# Patient Record
Sex: Male | Born: 1995 | Race: Black or African American | Hispanic: No | Marital: Single | Smoking: Current every day smoker
Health system: Southern US, Community
[De-identification: ages and names within clinical notes are randomized; demographics above are authoritative.]

## PROBLEM LIST (undated history)

## (undated) DIAGNOSIS — K589 Irritable bowel syndrome without diarrhea: Secondary | ICD-10-CM

## (undated) DIAGNOSIS — I10 Essential (primary) hypertension: Secondary | ICD-10-CM

## (undated) DIAGNOSIS — D573 Sickle-cell trait: Secondary | ICD-10-CM

## (undated) HISTORY — DX: Irritable bowel syndrome, unspecified: K58.9

---
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
## 2013-01-05 ENCOUNTER — Encounter (HOSPITAL_COMMUNITY): Payer: Self-pay | Admitting: Emergency Medicine

## 2013-01-05 ENCOUNTER — Emergency Department (HOSPITAL_COMMUNITY)
Admission: EM | Admit: 2013-01-05 | Discharge: 2013-01-05 | Disposition: A | Discharge status: Home / Self Care | Payer: Medicaid Other | Attending: Emergency Medicine | Admitting: Emergency Medicine

## 2013-01-05 DIAGNOSIS — Z202 Contact with and (suspected) exposure to infections with a predominantly sexual mode of transmission: Secondary | ICD-10-CM

## 2013-01-05 DIAGNOSIS — D573 Sickle-cell trait: Secondary | ICD-10-CM | POA: Insufficient documentation

## 2013-01-05 DIAGNOSIS — Z7251 High risk heterosexual behavior: Secondary | ICD-10-CM | POA: Insufficient documentation

## 2013-01-05 HISTORY — DX: Sickle-cell trait: D57.3

## 2013-01-05 LAB — URINE MICROSCOPIC-ADD ON

## 2013-01-05 LAB — URINALYSIS, ROUTINE W REFLEX MICROSCOPIC
Glucose, UA: NEGATIVE mg/dL
Ketones, ur: NEGATIVE mg/dL
Protein, ur: NEGATIVE mg/dL
Urobilinogen, UA: 0.2 mg/dL (ref 0.0–1.0)

## 2013-01-05 LAB — HIV ANTIBODY (ROUTINE TESTING W REFLEX): HIV: NONREACTIVE

## 2013-01-05 LAB — RPR: RPR Ser Ql: NONREACTIVE

## 2013-01-05 NOTE — ED Provider Notes (Signed)
History     CSN: [id number]  Arrival date & time 01/05/13  0027   First MD Initiated Contact with Patient 01/05/13 0214      Chief Complaint  Patient presents with  . Exposure to STD    (Consider location/radiation/quality/duration/timing/severity/associated sxs/prior treatment) HPI Comments: Patient presents emergency department complaining of possible exposure to STD.  He reports that he has been sexually active without protection on off with one partner.  He found a white pill at his house and he looked up until plantar., but expressed that it was an HIV medication.  Patient states concerned that his partner that he's been sleeping what may be HIV positive and has not been truthful.  Patient states that he has been tested in the past with negative test results however no recent testing has been performed.  Patient is currently asymptomatic  Patient is a 17 y.o. male presenting with STD exposure. The history is provided by the patient.  Exposure to STD    Past Medical History  Diagnosis Date  . Sickle cell trait     History reviewed. No pertinent past surgical history.  History reviewed. No pertinent family history.  History  Substance Use Topics  . Smoking status: Never Smoker   . Smokeless tobacco: Not on file  . Alcohol Use: Yes      Review of Systems  All other systems reviewed and are negative.    Allergies  Review of patient's allergies indicates no known allergies.  Home Medications  No current outpatient prescriptions on file.  BP 148/84  Pulse 65  Temp(Src) 97.9 F (36.6 C) (Oral)  SpO2 100%  Physical Exam  Nursing note and vitals reviewed. Constitutional: He is oriented to person, place, and time. He appears well-developed and well-nourished. No distress.  HENT:  Head: Normocephalic and atraumatic.  Eyes: Conjunctivae and EOM are normal.  Neck: Normal range of motion.  Pulmonary/Chest: Effort normal.  Musculoskeletal: Normal range of  motion.  Neurological: He is alert and oriented to person, place, and time.  Skin: Skin is warm and dry. No rash noted. He is not diaphoretic.  Psychiatric: He has a normal mood and affect. His behavior is normal.    ED Course  Procedures (including critical care time)  Labs Reviewed  URINALYSIS, ROUTINE W REFLEX MICROSCOPIC - Abnormal; Notable for the following:    Leukocytes, UA SMALL (*)    All other components within normal limits  GC/CHLAMYDIA PROBE AMP  URINE MICROSCOPIC-ADD ON  HIV ANTIBODY (ROUTINE TESTING)  RPR   No results found.   No diagnosis found.    MDM  Possible STD exposure  It is explained to patient that when asymptomatic typically blood STD testing is not done in the emergency department and health department evaluation was recommended.  Patient appears distressed for concern of possible HIV contact therefore an exception was made and HIV RPR testing was performed.  Patient understands that results will not be completed for approximately 3 days.  No other complaints at this time.        Jaci Carrel, [place] 01/05/13 [phone]

## 2013-01-05 NOTE — ED Notes (Signed)
Patient reports that he found a pill laying around his apartment; patient looked up pill on the Internet and found out that it was a medication for HIV.  Patient feels that the pill might belong to his partner, so he wants to be tested for HIV today.  Patient has been tested in the past (several months ago); results negative.

## 2013-01-05 NOTE — ED Notes (Signed)
Pt dc'd home w/all belongings, pt verbalizes understanding of dc instructions, pt alert and ambulatory upon dc, drove self home no narcotics given in ED

## 2013-01-05 NOTE — ED Provider Notes (Signed)
Medical screening examination/treatment/procedure(s) were performed by non-physician practitioner and as supervising physician I was immediately available for consultation/collaboration.  Semiah Konczal, MD 01/05/13 2305 

## 2013-01-05 NOTE — ED Notes (Signed)
Pt reports he found a single pill lying around his house today, looked the pill up and it is prescribed for HIV, pt did not bring the pill with him put took a picture with his phone, the pill is white, oblonged shape, and has the numbers 123 on the front of it. Pt is concerned his partner of 3 years may be taking this medication with out telling him. Pt states they have been in an "on again off again" relationship for 3 years, last unprotected intercourse was Saturday, they reunited 3 months ago and as far as the patients knows his partner is not seeing someone else.  Pt is requesting to be tested for HIV, pt's last HIV test was completed 06/02/2011 and confirmed negative on 06/04/2011

## 2013-01-06 LAB — GC/CHLAMYDIA PROBE AMP: CT Probe RNA: NEGATIVE

## 2013-01-07 ENCOUNTER — Telehealth (HOSPITAL_COMMUNITY): Payer: Self-pay | Admitting: Emergency Medicine

## 2013-01-07 NOTE — ED Notes (Signed)
+   gonorrhea, Chart sent to EDP for review

## 2013-01-08 ENCOUNTER — Telehealth (HOSPITAL_COMMUNITY): Payer: Self-pay | Admitting: Emergency Medicine

## 2013-11-10 DIAGNOSIS — R197 Diarrhea, unspecified: Secondary | ICD-10-CM | POA: Insufficient documentation

## 2013-11-10 DIAGNOSIS — R110 Nausea: Secondary | ICD-10-CM | POA: Insufficient documentation

## 2013-11-10 DIAGNOSIS — R630 Anorexia: Secondary | ICD-10-CM | POA: Insufficient documentation

## 2013-11-10 DIAGNOSIS — E860 Dehydration: Secondary | ICD-10-CM | POA: Insufficient documentation

## 2013-11-10 DIAGNOSIS — Z862 Personal history of diseases of the blood and blood-forming organs and certain disorders involving the immune mechanism: Secondary | ICD-10-CM | POA: Insufficient documentation

## 2013-11-11 ENCOUNTER — Emergency Department (HOSPITAL_COMMUNITY)
Admission: EM | Admit: 2013-11-11 | Discharge: 2013-11-11 | Disposition: A | Discharge status: Home / Self Care | Payer: BC Managed Care – PPO | Attending: Emergency Medicine | Admitting: Emergency Medicine

## 2013-11-11 ENCOUNTER — Encounter (HOSPITAL_COMMUNITY): Payer: Self-pay | Admitting: Emergency Medicine

## 2013-11-11 DIAGNOSIS — R197 Diarrhea, unspecified: Secondary | ICD-10-CM

## 2013-11-11 LAB — CBC WITH DIFFERENTIAL/PLATELET
BASOS ABS: 0 10*3/uL (ref 0.0–0.1)
Basophils Relative: 1 % (ref 0–1)
EOS ABS: 0 10*3/uL (ref 0.0–0.7)
Eosinophils Relative: 0 % (ref 0–5)
HEMATOCRIT: 56.8 % — AB (ref 39.0–52.0)
Hemoglobin: 19.5 g/dL — ABNORMAL HIGH (ref 13.0–17.0)
LYMPHS PCT: 30 % (ref 12–46)
Lymphs Abs: 1.4 10*3/uL (ref 0.7–4.0)
MCH: 27.9 pg (ref 26.0–34.0)
MCHC: 34.3 g/dL (ref 30.0–36.0)
MCV: 81.4 fL (ref 78.0–100.0)
MONO ABS: 0.8 10*3/uL (ref 0.1–1.0)
Monocytes Relative: 16 % — ABNORMAL HIGH (ref 3–12)
NEUTROS ABS: 2.5 10*3/uL (ref 1.7–7.7)
Neutrophils Relative %: 53 % (ref 43–77)
Platelets: 158 10*3/uL (ref 150–400)
RBC: 6.98 MIL/uL — ABNORMAL HIGH (ref 4.22–5.81)
RDW: 14.2 % (ref 11.5–15.5)
WBC: 4.7 10*3/uL (ref 4.0–10.5)

## 2013-11-11 LAB — COMPREHENSIVE METABOLIC PANEL
ALBUMIN: 4.3 g/dL (ref 3.5–5.2)
ALK PHOS: 72 U/L (ref 39–117)
ALT: 22 U/L (ref 0–53)
AST: 31 U/L (ref 0–37)
BUN: 12 mg/dL (ref 6–23)
CALCIUM: 9.5 mg/dL (ref 8.4–10.5)
CO2: 27 mEq/L (ref 19–32)
CREATININE: 1.16 mg/dL (ref 0.50–1.35)
Chloride: 105 mEq/L (ref 96–112)
GFR calc non Af Amer: >90 mL/min (ref >90)
Glucose, Bld: 80 mg/dL (ref 70–99)
POTASSIUM: 4.4 meq/L (ref 3.7–5.3)
Sodium: 144 mEq/L (ref 137–147)
TOTAL PROTEIN: 7.9 g/dL (ref 6.0–8.3)
Total Bilirubin: 0.5 mg/dL (ref 0.3–1.2)

## 2013-11-11 LAB — URINALYSIS, ROUTINE W REFLEX MICROSCOPIC
Bilirubin Urine: NEGATIVE
GLUCOSE, UA: NEGATIVE mg/dL
Hgb urine dipstick: NEGATIVE
Ketones, ur: 15 mg/dL — AB
LEUKOCYTES UA: NEGATIVE
NITRITE: NEGATIVE
PH: 5 (ref 5.0–8.0)
Protein, ur: NEGATIVE mg/dL
SPECIFIC GRAVITY, URINE: 1.019 (ref 1.005–1.030)
Urobilinogen, UA: 0.2 mg/dL (ref 0.0–1.0)

## 2013-11-11 MED ORDER — DICYCLOMINE HCL 10 MG PO CAPS
ORAL_CAPSULE | ORAL | Status: AC
  Filled 2013-11-11: qty 2

## 2013-11-11 MED ORDER — ONDANSETRON 4 MG PO TBDP
8.0000 mg | ORAL_TABLET | Freq: Once | ORAL | Status: AC
  Administered 2013-11-11: 8 mg via ORAL

## 2013-11-11 MED ORDER — LOPERAMIDE HCL 2 MG PO CAPS: 2.0000 mg | ORAL_CAPSULE | Freq: Four times a day (QID) | ORAL | Status: DC | PRN

## 2013-11-11 MED ORDER — LOPERAMIDE HCL 2 MG PO CAPS
4.0000 mg | ORAL_CAPSULE | Freq: Once | ORAL | Status: AC
  Administered 2013-11-11: 4 mg via ORAL

## 2013-11-11 MED ORDER — DICYCLOMINE HCL 10 MG PO CAPS
20.0000 mg | ORAL_CAPSULE | Freq: Once | ORAL | Status: AC
  Administered 2013-11-11: 20 mg via ORAL

## 2013-11-11 MED ORDER — ONDANSETRON 4 MG PO TBDP
ORAL_TABLET | ORAL | Status: AC
  Filled 2013-11-11: qty 2

## 2013-11-11 MED ORDER — DICYCLOMINE HCL 20 MG PO TABS: 20.0000 mg | ORAL_TABLET | Freq: Four times a day (QID) | ORAL | Status: DC | PRN

## 2013-11-11 MED ORDER — LOPERAMIDE HCL 2 MG PO CAPS
ORAL_CAPSULE | ORAL | Status: AC
  Filled 2013-11-11: qty 2

## 2013-11-11 MED ORDER — ONDANSETRON 8 MG PO TBDP: 8.0000 mg | ORAL_TABLET | Freq: Three times a day (TID) | ORAL | Status: DC | PRN

## 2013-11-11 NOTE — ED Notes (Signed)
Pt. reports intermittent diarrhea for several days , denies fever or chills . No nausea or vomitting .

## 2013-11-11 NOTE — Discharge Instructions (Signed)
Diarrhea Diarrhea is frequent loose and watery bowel movements. It can cause you to feel weak and dehydrated. Dehydration can cause you to become tired and thirsty, have a dry mouth, and have decreased urination that often is dark yellow. Diarrhea is a sign of another problem, most often an infection that will not last long. In most cases, diarrhea typically lasts 2 3 days. However, it can last longer if it is a sign of something more serious. It is important to treat your diarrhea as directed by your caregive to lessen or prevent future episodes of diarrhea. CAUSES  Some common causes include:  Gastrointestinal infections caused by viruses, bacteria, or parasites.  Food poisoning or food allergies.  Certain medicines, such as antibiotics, chemotherapy, and laxatives.  Artificial sweeteners and fructose.  Digestive disorders. HOME CARE INSTRUCTIONS  Ensure adequate fluid intake (hydration): have 1 cup (8 oz) of fluid for each diarrhea episode. Avoid fluids that contain simple sugars or sports drinks, fruit juices, whole milk products, and sodas. Your urine should be clear or pale yellow if you are drinking enough fluids. Hydrate with an oral rehydration solution that you can purchase at pharmacies, retail stores, and online. You can prepare an oral rehydration solution at home by mixing the following ingredients together:    tsp table salt.   tsp baking soda.   tsp salt substitute containing potassium chloride.  1  tablespoons sugar.  1 L (34 oz) of water.  Certain foods and beverages may increase the speed at which food moves through the gastrointestinal (GI) tract. These foods and beverages should be avoided and include:  Caffeinated and alcoholic beverages.  High-fiber foods, such as raw fruits and vegetables, nuts, seeds, and whole grain breads and cereals.  Foods and beverages sweetened with sugar alcohols, such as xylitol, sorbitol, and mannitol.  Some foods may be well  tolerated and may help thicken stool including:  Starchy foods, such as rice, toast, pasta, low-sugar cereal, oatmeal, grits, baked potatoes, crackers, and bagels.  Bananas.  Applesauce.  Add probiotic-rich foods to help increase healthy bacteria in the GI tract, such as yogurt and fermented milk products.  Wash your hands well after each diarrhea episode.  Only take over-the-counter or prescription medicines as directed by your caregiver.  Take a warm bath to relieve any burning or pain from frequent diarrhea episodes. SEEK IMMEDIATE MEDICAL CARE IF:   You are unable to keep fluids down.  You have persistent vomiting.  You have blood in your stool, or your stools are black and tarry.  You do not urinate in 6 8 hours, or there is only a small amount of very dark urine.  You have abdominal pain that increases or localizes.  You have weakness, dizziness, confusion, or lightheadedness.  You have a severe headache.  Your diarrhea gets worse or does not get better.  You have a fever or persistent symptoms for more than 2 3 days.  You have a fever and your symptoms suddenly get worse. MAKE SURE YOU:   Understand these instructions.  Will watch your condition.  Will get help right away if you are not doing well or get worse. Document Released: 10/16/2002 Document Revised: 10/12/2012 Document Reviewed: 07/03/2012 [facility] Patient Information 2014 [place], [place].  Diet for Diarrhea, Adult Frequent, runny stools (diarrhea) may be caused or worsened by food or drink. Diarrhea may be relieved by changing your diet. Since diarrhea can last up to 7 days, it is easy for you to lose too much  fluid from the body and become dehydrated. Fluids that are lost need to be replaced. Along with a modified diet, make sure you drink enough fluids to keep your urine clear or pale yellow. DIET INSTRUCTIONS  Ensure adequate fluid intake (hydration): have 1 cup (8 oz) of fluid for each diarrhea  episode. Avoid fluids that contain simple sugars or sports drinks, fruit juices, whole milk products, and sodas. Your urine should be clear or pale yellow if you are drinking enough fluids. Hydrate with an oral rehydration solution that you can purchase at pharmacies, retail stores, and online. You can prepare an oral rehydration solution at home by mixing the following ingredients together:    tsp table salt.   tsp baking soda.   tsp salt substitute containing potassium chloride.  1  tablespoons sugar.  1 L (34 oz) of water.  Certain foods and beverages may increase the speed at which food moves through the gastrointestinal (GI) tract. These foods and beverages should be avoided and include:  Caffeinated and alcoholic beverages.  High-fiber foods, such as raw fruits and vegetables, nuts, seeds, and whole grain breads and cereals.  Foods and beverages sweetened with sugar alcohols, such as xylitol, sorbitol, and mannitol.  Some foods may be well tolerated and may help thicken stool including:  Starchy foods, such as rice, toast, pasta, low-sugar cereal, oatmeal, grits, baked potatoes, crackers, and bagels.   Bananas.   Applesauce.  Add probiotic-rich foods to help increase healthy bacteria in the GI tract, such as yogurt and fermented milk products. RECOMMENDED FOODS AND BEVERAGES Starches Choose foods with less than 2 g of fiber per serving.  Recommended:  White, [place], and pita breads, plain rolls, buns, bagels. Plain muffins, matzo. Soda, saltine, or graham crackers. Pretzels, melba toast, zwieback. Cooked cereals made with water: cornmeal, farina, cream cereals. Dry cereals: refined corn, wheat, rice. Potatoes prepared any way without skins, refined macaroni, spaghetti, noodles, refined rice.  Avoid:  Bread, rolls, or crackers made with whole wheat, multi-grains, rye, bran seeds, nuts, or coconut. Corn tortillas or taco shells. Cereals containing whole grains, multi-grains,  bran, coconut, nuts, raisins. Cooked or dry oatmeal. Coarse wheat cereals, granola. Cereals advertised as "high-fiber." Potato skins. Whole grain pasta, wild or brown rice. Popcorn. Sweet potatoes, yams. Sweet rolls, doughnuts, waffles, pancakes, sweet breads. Vegetables  Recommended: Strained tomato and vegetable juices. Most well-cooked and canned vegetables without seeds. Fresh: Tender lettuce, cucumber without the skin, cabbage, spinach, bean sprouts.  Avoid: Fresh, cooked, or canned: Artichokes, baked beans, beet greens, broccoli, Brussels sprouts, corn, kale, legumes, peas, sweet potatoes. Cooked: Green or red cabbage, spinach. Avoid large servings of any vegetables because vegetables shrink when cooked, and they contain more fiber per serving than fresh vegetables. Fruit  Recommended: Cooked or canned: Apricots, applesauce, cantaloupe, cherries, fruit cocktail, grapefruit, grapes, kiwi, mandarin oranges, peaches, pears, plums, watermelon. Fresh: Apples without skin, ripe banana, grapes, cantaloupe, cherries, grapefruit, peaches, oranges, plums. Keep servings limited to  cup or 1 piece.  Avoid: Fresh: Apples with skin, apricots, mangoes, pears, raspberries, strawberries. Prune juice, stewed or dried prunes. Dried fruits, raisins, dates. Large servings of all fresh fruits. Protein  Recommended: Ground or well-cooked tender beef, ham, veal, lamb, pork, or poultry. Eggs. Fish, oysters, shrimp, lobster, other seafoods. Liver, organ meats.  Avoid: Tough, fibrous meats with gristle. Peanut butter, smooth or chunky. Cheese, nuts, seeds, legumes, dried peas, beans, lentils. Dairy  Recommended: Yogurt, lactose-free milk, kefir, drinkable yogurt, buttermilk, soy milk, or plain hard cheese.  Avoid: Milk, chocolate milk, beverages made with milk, such as milkshakes. Soups  Recommended: Bouillon, broth, or soups made from allowed foods. Any strained soup.  Avoid: Soups made from vegetables that  are not allowed, cream or milk-based soups. Desserts and Sweets  Recommended: Sugar-free gelatin, sugar-free frozen ice pops made without sugar alcohol.  Avoid: Plain cakes and cookies, pie made with fruit, pudding, custard, cream pie. Gelatin, fruit, ice, sherbet, frozen ice pops. Ice cream, ice milk without nuts. Plain hard candy, honey, jelly, molasses, syrup, sugar, chocolate syrup, gumdrops, marshmallows. Fats and Oils  Recommended: Limit fats to less than 8 tsp per day.  Avoid: Seeds, nuts, olives, avocados. Margarine, butter, cream, mayonnaise, salad oils, plain salad dressings. Plain gravy, crisp bacon without rind. Beverages  Recommended: Water, decaffeinated teas, oral rehydration solutions, sugar-free beverages not sweetened with sugar alcohols.  Avoid: Fruit juices, caffeinated beverages (coffee, tea, soda), alcohol, sports drinks, or lemon-lime soda. Condiments  Recommended: Ketchup, mustard, horseradish, vinegar, cocoa powder. Spices in moderation: allspice, basil, bay leaves, celery powder or leaves, cinnamon, cumin powder, curry powder, ginger, mace, marjoram, onion or garlic powder, oregano, paprika, parsley flakes, ground pepper, rosemary, sage, savory, tarragon, thyme, turmeric.  Avoid: Coconut, honey. Document Released: 01/16/2004 Document Revised: 07/20/2012 Document Reviewed: 03/11/2012 [employer] Patient Information 2014 [employer].  

## 2013-11-11 NOTE — ED Provider Notes (Signed)
CSN: [id number]     Arrival date & time 11/10/13  2359 History   First MD Initiated Contact with Patient 11/11/13 0315     Chief Complaint  Patient presents with  . Diarrhea   (Consider location/radiation/quality/duration/timing/severity/associated sxs/prior Treatment) HPI 18 year old male presents to emergency room with complaint of 5 days of loose stools.  He reports initially stools were just loose, now they are very watery.  He has had nausea with no vomiting.  No fevers or chills.  No known sick contacts.  Patient is concerned that he has a bacterial infection as he recently had oral genital and oral anal contact prior to onset of symptoms.  He denies any abdominal pain other than occasional crampy pain just prior to having a bowel movement.  Patient has had loss of appetite due to ongoing diarrhea.  No over-the-counter medications taken prior to arrival. Past Medical History  Diagnosis Date  . Sickle cell trait    History reviewed. No pertinent past surgical history. No family history on file. History  Substance Use Topics  . Smoking status: Never Smoker   . Smokeless tobacco: Not on file  . Alcohol Use: Yes    Review of Systems  See History of Present Illness; otherwise all other systems are reviewed and negative Allergies  Review of patient's allergies indicates no known allergies.  Home Medications   Current Outpatient Rx  Name  Route  Sig  Dispense  Refill  . dicyclomine (BENTYL) 20 MG tablet   Oral   Take 1 tablet (20 mg total) by mouth every 6 (six) hours as needed for spasms (for abdominal cramping).   20 tablet   0   . loperamide (IMODIUM) 2 MG capsule   Oral   Take 1 capsule (2 mg total) by mouth 4 (four) times daily as needed for diarrhea or loose stools.   12 capsule   0   . ondansetron (ZOFRAN-ODT) 8 MG disintegrating tablet   Oral   Take 1 tablet (8 mg total) by mouth every 8 (eight) hours as needed for nausea or vomiting.   20 tablet   0    BP  158/102  Pulse 81  Temp(Src) 97.5 F (36.4 C) (Oral)  Resp 20  Wt 209 lb 8 oz (95.029 kg)  SpO2 100% Physical Exam  Nursing note and vitals reviewed. Constitutional: He is oriented to person, place, and time. He appears well-developed and well-nourished.  HENT:  Head: Normocephalic and atraumatic.  Nose: Nose normal.  Mouth/Throat: Oropharynx is clear and moist.  Eyes: Conjunctivae and EOM are normal. Pupils are equal, round, and reactive to light.  Neck: Normal range of motion. Neck supple. No JVD present. No tracheal deviation present. No thyromegaly present.  Cardiovascular: Normal rate, regular rhythm, normal heart sounds and intact distal pulses.  Exam reveals no gallop and no friction rub.   No murmur heard. Pulmonary/Chest: Effort normal and breath sounds normal. No stridor. No respiratory distress. He has no wheezes. He has no rales. He exhibits no tenderness.  Abdominal: Soft. Bowel sounds are normal. He exhibits no distension and no mass. There is no tenderness. There is no rebound and no guarding.  Musculoskeletal: Normal range of motion. He exhibits no edema and no tenderness.  Lymphadenopathy:    He has no cervical adenopathy.  Neurological: He is alert and oriented to person, place, and time. He exhibits normal muscle tone. Coordination normal.  Skin: Skin is warm and dry. No rash noted. No erythema. No pallor.  Psychiatric: He has a normal mood and affect. His behavior is normal. Judgment and thought content normal.    ED Course  Procedures (including critical care time) Labs Review Labs Reviewed  CBC WITH DIFFERENTIAL - Abnormal; Notable for the following:    RBC 6.98 (*)    Hemoglobin 19.5 (*)    HCT 56.8 (*)    Monocytes Relative 16 (*)    All other components within normal limits  URINALYSIS, ROUTINE W REFLEX MICROSCOPIC - Abnormal; Notable for the following:    APPearance CLOUDY (*)    Ketones, ur 15 (*)    All other components within normal limits   COMPREHENSIVE METABOLIC PANEL   Imaging Review No results found.  EKG Interpretation   None       MDM   1. Diarrhea    Mild dehydration on labs and physical exam.  Patient has not had any diarrhea since being here in the emergency department.  I do not feel he has any serious bacterial infection.  Symptomatic treatment advised.  Patient given strict return precautions    Kalman Drape, MD 11/11/13 [phone]

## 2013-11-14 LAB — PATHOLOGIST SMEAR REVIEW: PATH REVIEW: REACTIVE

## 2014-03-17 ENCOUNTER — Encounter (HOSPITAL_COMMUNITY): Payer: Self-pay | Admitting: Emergency Medicine

## 2014-03-17 ENCOUNTER — Emergency Department (HOSPITAL_COMMUNITY)
Admission: EM | Admit: 2014-03-17 | Discharge: 2014-03-17 | Disposition: A | Discharge status: Home / Self Care | Payer: BC Managed Care – PPO | Attending: Emergency Medicine | Admitting: Emergency Medicine

## 2014-03-17 DIAGNOSIS — Z862 Personal history of diseases of the blood and blood-forming organs and certain disorders involving the immune mechanism: Secondary | ICD-10-CM | POA: Insufficient documentation

## 2014-03-17 DIAGNOSIS — R509 Fever, unspecified: Secondary | ICD-10-CM | POA: Insufficient documentation

## 2014-03-17 DIAGNOSIS — B9789 Other viral agents as the cause of diseases classified elsewhere: Secondary | ICD-10-CM | POA: Insufficient documentation

## 2014-03-17 DIAGNOSIS — R51 Headache: Secondary | ICD-10-CM | POA: Insufficient documentation

## 2014-03-17 DIAGNOSIS — B349 Viral infection, unspecified: Secondary | ICD-10-CM

## 2014-03-17 DIAGNOSIS — J3489 Other specified disorders of nose and nasal sinuses: Secondary | ICD-10-CM | POA: Insufficient documentation

## 2014-03-17 DIAGNOSIS — R109 Unspecified abdominal pain: Secondary | ICD-10-CM | POA: Insufficient documentation

## 2014-03-17 DIAGNOSIS — R05 Cough: Secondary | ICD-10-CM | POA: Insufficient documentation

## 2014-03-17 DIAGNOSIS — R5383 Other fatigue: Secondary | ICD-10-CM

## 2014-03-17 DIAGNOSIS — R197 Diarrhea, unspecified: Secondary | ICD-10-CM | POA: Insufficient documentation

## 2014-03-17 DIAGNOSIS — R5381 Other malaise: Secondary | ICD-10-CM | POA: Insufficient documentation

## 2014-03-17 DIAGNOSIS — R059 Cough, unspecified: Secondary | ICD-10-CM | POA: Insufficient documentation

## 2014-03-17 LAB — BASIC METABOLIC PANEL
BUN: 9 mg/dL (ref 6–23)
CO2: 25 meq/L (ref 19–32)
Calcium: 9.6 mg/dL (ref 8.4–10.5)
Chloride: 99 mEq/L (ref 96–112)
Creatinine, Ser: 1.11 mg/dL (ref 0.50–1.35)
GFR calc Af Amer: >90 mL/min (ref >90)
GFR calc non Af Amer: >90 mL/min (ref >90)
GLUCOSE: 91 mg/dL (ref 70–99)
POTASSIUM: 4 meq/L (ref 3.7–5.3)
SODIUM: 139 meq/L (ref 137–147)

## 2014-03-17 LAB — CBC
HCT: 58.3 % — ABNORMAL HIGH (ref 39.0–52.0)
HEMOGLOBIN: 20.5 g/dL — AB (ref 13.0–17.0)
MCH: 28.6 pg (ref 26.0–34.0)
MCHC: 35.2 g/dL (ref 30.0–36.0)
MCV: 81.2 fL (ref 78.0–100.0)
PLATELETS: 145 10*3/uL — AB (ref 150–400)
RBC: 7.18 MIL/uL — AB (ref 4.22–5.81)
RDW: 14.2 % (ref 11.5–15.5)
WBC: 4 10*3/uL (ref 4.0–10.5)

## 2014-03-17 MED ORDER — GUAIFENESIN 100 MG/5ML PO LIQD: 100.0000 mg | ORAL | Status: DC | PRN

## 2014-03-17 MED ORDER — HYDROCODONE-ACETAMINOPHEN 7.5-325 MG/15ML PO SOLN: 10.0000 mL | Freq: Four times a day (QID) | ORAL | Status: DC | PRN

## 2014-03-17 NOTE — ED Notes (Signed)
Onset 11am body aches, fever, headache, lower abd pain, diarrhea x 2, loose and light brown.

## 2014-03-17 NOTE — ED Provider Notes (Signed)
CSN: [id number]     Arrival date & time 03/17/14  1759 History  This chart was scribed for non-physician practitioner, Domenic Moras, PA-C working with Osvaldo Shipper, MD by Frederich Balding, ED scribe. This patient was seen in room TR10C/TR10C and the patient's care was started at 9:24 PM.   Chief Complaint  Patient presents with  . Generalized Body Aches  . Fever  . Headache   The history is provided by the patient. No language interpreter was used.   HPI Comments: Joe Vang is a 18 y.o. male who presents to the Emergency Department complaining of generalized myalgias, headache, rhinorrhea, mild cough, fever and chills that started today around 11 AM. States he is also having abdominal pain and diarrhea that started around the same time. Pt has not taken any medications but has drank Gatorade. Denies sneezing, nausea, emesis, hematochezia, chest pain, trouble breathing, SOB, difficulty urinating, dysuria. Denies sick contacts. Denies recent travel.   Past Medical History  Diagnosis Date  . Sickle cell trait    History reviewed. No pertinent past surgical history. No family history on file. History  Substance Use Topics  . Smoking status: Never Smoker   . Smokeless tobacco: Not on file  . Alcohol Use: Yes    Review of Systems  Constitutional: Positive for fever and chills.  HENT: Positive for rhinorrhea. Negative for sneezing.   Respiratory: Positive for cough. Negative for shortness of breath.   Cardiovascular: Negative for chest pain.  Gastrointestinal: Positive for abdominal pain and diarrhea. Negative for nausea, vomiting and blood in stool.  Genitourinary: Negative for dysuria and difficulty urinating.  Musculoskeletal: Positive for myalgias.  Neurological: Positive for headaches.  All other systems reviewed and are negative.  Allergies  Review of patient's allergies indicates no known allergies.  Home Medications   Prior to Admission medications   Medication  Sig Start Date End Date Taking? Authorizing Provider  dicyclomine (BENTYL) 20 MG tablet Take 1 tablet (20 mg total) by mouth every 6 (six) hours as needed for spasms (for abdominal cramping). 11/11/13   Kalman Drape, MD  loperamide (IMODIUM) 2 MG capsule Take 1 capsule (2 mg total) by mouth 4 (four) times daily as needed for diarrhea or loose stools. 11/11/13   Kalman Drape, MD  ondansetron (ZOFRAN-ODT) 8 MG disintegrating tablet Take 1 tablet (8 mg total) by mouth every 8 (eight) hours as needed for nausea or vomiting. 11/11/13   Kalman Drape, MD   BP 149/95  Pulse 108  Temp(Src) 98.6 F (37 C) (Axillary)  Resp 18  SpO2 99%  Physical Exam  Nursing note and vitals reviewed. Constitutional: He is oriented to person, place, and time. He appears well-developed and well-nourished. No distress.  HENT:  Head: Normocephalic and atraumatic.  Right Ear: Tympanic membrane and ear canal normal.  Left Ear: Tympanic membrane and ear canal normal.  Mouth/Throat: Oropharynx is clear and moist. No oropharyngeal exudate, posterior oropharyngeal edema or posterior oropharyngeal erythema.  Eyes: EOM are normal.  Neck: Neck supple. No tracheal deviation present.  No nuchal rigidity  Cardiovascular: Normal rate, regular rhythm and normal heart sounds.   Pulmonary/Chest: Effort normal and breath sounds normal. No respiratory distress. He has no wheezes. He has no rales.  Abdominal: Soft. There is no tenderness.  No peritoneal sign. Negative psoas sign.  Musculoskeletal: Normal range of motion.  Neurological: He is alert and oriented to person, place, and time.  Skin: Skin is warm and dry.  Psychiatric: He  has a normal mood and affect. His behavior is normal.    ED Course  Procedures (including critical care time)  DIAGNOSTIC STUDIES: Oxygen Saturation is 99% on RA, normal by my interpretation.    COORDINATION OF CARE: 9:29 PM-Discussed treatment plan which includes cough medication and decongestant with  pt at bedside and pt agreed to plan. Advised pt he has a viral infection. Return precautions given.  Low suspicion for appy, however pt made aware to return if worsen.   Labs Review Labs Reviewed  CBC - Abnormal; Notable for the following:    RBC 7.18 (*)    Hemoglobin 20.5 (*)    HCT 58.3 (*)    Platelets 145 (*)    All other components within normal limits  BASIC METABOLIC PANEL    Imaging Review No results found.   EKG Interpretation None      MDM   Final diagnoses:  Viral syndrome    BP 147/89  Pulse 100  Temp(Src) 99.3 F (37.4 C) (Oral)  Resp 20  SpO2 96%  I have reviewed nursing notes and vital signs. I personally reviewed the imaging tests through PACS system  I reviewed available ER/hospitalization records thought the EMR   I personally performed the services described in this documentation, which was scribed in my presence. The recorded information has been reviewed and is accurate.  Domenic Moras, PA-C 03/17/14 2135

## 2014-03-17 NOTE — ED Provider Notes (Signed)
Medical screening examination/treatment/procedure(s) were performed by non-physician practitioner and as supervising physician I was immediately available for consultation/collaboration.   EKG Interpretation None        William Yamili Lichtenwalner, MD 03/17/14 2242 

## 2014-03-17 NOTE — Discharge Instructions (Signed)
Return if you develop fever, worsening abdominal pain, unable to eat or if you have any other concerns.   Viral Infections A viral infection can be caused by different types of viruses.Most viral infections are not serious and resolve on their own. However, some infections may cause severe symptoms and may lead to further complications. SYMPTOMS Viruses can frequently cause:  Minor sore throat.  Aches and pains.  Headaches.  Runny nose.  Different types of rashes.  Watery eyes.  Tiredness.  Cough.  Loss of appetite.  Gastrointestinal infections, resulting in nausea, vomiting, and diarrhea. These symptoms do not respond to antibiotics because the infection is not caused by bacteria. However, you might catch a bacterial infection following the viral infection. This is sometimes called a "superinfection." Symptoms of such a bacterial infection may include:  Worsening sore throat with pus and difficulty swallowing.  Swollen neck glands.  Chills and a high or persistent fever.  Severe headache.  Tenderness over the sinuses.  Persistent overall ill feeling (malaise), muscle aches, and tiredness (fatigue).  Persistent cough.  Yellow, green, or brown mucus production with coughing. HOME CARE INSTRUCTIONS   Only take over-the-counter or prescription medicines for pain, discomfort, diarrhea, or fever as directed by your caregiver.  Drink enough water and fluids to keep your urine clear or pale yellow. Sports drinks can provide valuable electrolytes, sugars, and hydration.  Get plenty of rest and maintain proper nutrition. Soups and broths with crackers or rice are fine. SEEK IMMEDIATE MEDICAL CARE IF:   You have severe headaches, shortness of breath, chest pain, neck pain, or an unusual rash.  You have uncontrolled vomiting, diarrhea, or you are unable to keep down fluids.  You or your child has an oral temperature above 102 F (38.9 C), not controlled by  medicine.  Your baby is older than 3 months with a rectal temperature of 102 F (38.9 C) or higher.  Your baby is 48 months old or younger with a rectal temperature of 100.4 F (38 C) or higher. MAKE SURE YOU:   Understand these instructions.  Will watch your condition.  Will get help right away if you are not doing well or get worse. Document Released: 08/05/2005 Document Revised: 01/18/2012 Document Reviewed: 03/02/2011 [facility] Patient Information 2014 [place], [place].   Emergency Department Resource Guide 1) Find a Doctor and Pay Out of Pocket Although you won't have to find out who is covered by your insurance plan, it is a good idea to ask around and get recommendations. You will then need to call the office and see if the doctor you have chosen will accept you as a new patient and what types of options they offer for patients who are self-pay. Some doctors offer discounts or will set up payment plans for their patients who do not have insurance, but you will need to ask so you aren't surprised when you get to your appointment.  2) Contact Your Local Health Department Not all health departments have doctors that can see patients for sick visits, but many do, so it is worth a call to see if yours does. If you don't know where your local health department is, you can check in your phone book. The CDC also has a tool to help you locate your state's health department, and many state websites also have listings of all of their local health departments.  3) Find a [facility] If your illness is not likely to be very severe or complicated, you may want  to try a walk in clinic. These are popping up all over the country in pharmacies, drugstores, and shopping centers. They're usually staffed by nurse practitioners or physician assistants that have been trained to treat common illnesses and complaints. They're usually fairly quick and inexpensive. However, if you have serious medical issues  or chronic medical problems, these are probably not your best option.  No Primary Care Doctor: - Call Health Connect at  [phone] - they can help you locate a primary care doctor that  accepts your insurance, provides certain services, etc. - Physician Referral Service- [phone]  Chronic Pain Problems: Organization         Address  Phone   Notes  [facility]  [phone] Patients need to be referred by their primary care doctor.   Medication Assistance: Organization         Address  Phone   Notes  [facility] Medication [facility] [place], [place] [address] [phone] --Must be a resident of [facility] -- Must have NO insurance coverage whatsoever (no Medicaid/ Medicare, etc.) -- The pt. MUST have a primary care doctor that directs their care regularly and follows them in the community   [facility]ist  [phone]   [employer]  [phone]    Agencies that provide inexpensive medical care: Organization         Address  Phone   Notes  [place]  [phone]   [facility]    [phone]   [facility] [place], [place] [address] [phone]   [facility] [address], [place] [phone]   Planned Parenthood    [phone]   [facility]    [phone]   [place] [address], [place] Phone:  [phone], Fax:  [phone] Hours of Operation:  9 am - 6 pm, M-F.  Also accepts Medicaid/Medicare and self-pay.  [facility] for [place] [address], [address], [place] Phone: [phone], Fax: [phone]. Hours of Operation:  8:30 am - 5:30 pm, M-F.  Also accepts Medicaid and self-pay.  [facility] [place] [address] [place] Phone: [phone]   [place], [place], [place]  [phone], Ext. [phone] Mondays & Thursdays: 7-9 AM.  First 15 patients are seen on a first come, first serve basis.    [place] Providers:  Organization         Address  Phone   Notes  [facility] [address],  [place] [phone] Also accepts self-pay patients.  [facility] [address], [place]  [phone]   [place], [address], [place] [phone]   [facility] [address] [place] [phone]   Lucianne Lei [address], [place]   [phone] Only accepts [place] Access [place] patients after they have their name applied to their card.   Self-Pay (no insurance) in [facility]:  Organization         Address  Phone   Notes  Sickle Cell Patients, [facility] Internal Medicine [place] [phone]   [facility]gent Care [place] [phone]  [facility] Care [place]  [place], [address], [place] [phone]   [facility]/Dr. Osei-Bonsu  [address], [place] or [address], [place] [phone] Phone number for both [place] and [place] locations is the same.  [facility] [address], [place] [phone]   [facility] [address], [place] or [address] [phone]   [facility] [address], [place] [phone], phone; [phone], fax Sees patients 1st and 3rd Saturday of every month.  Must not qualify for public or private insurance (i.e. Medicaid, Medicare, [employer] Health Choice, Veterans' Benefits)  Household income should be no more than 200% of the poverty level The clinic cannot treat you if you are pregnant or think you are pregnant  Sexually transmitted  diseases are not treated at the clinic.    Dental Care: Organization         Address  Phone  Notes  [facility] [place] [phone] Accepts children up to age 27 who are enrolled in [place] or [place]; pregnant women with a Medicaid card; and children who have applied for Medicaid or [place] Health Choice, but were declined, whose parents can pay a reduced fee at time of service.  [facility] Department of [facility]  [address], [place] [phone] Accepts children up to age 46 who are enrolled in [place] or [place]; pregnant women with a Medicaid card; and children who have applied for Medicaid or [place] Health Choice, but were declined, whose parents can pay a reduced fee at time of service.  [place] Adult Dental Access PROGRAM  [place] [phone] Patients are seen by appointment only. Walk-ins are not accepted. [place] will see patients 50 years of age and older. Monday - Tuesday (8am-5pm) Most Wednesdays (8:30-5pm) $30 per visit, cash only  [facility] Adult Dental Access PROGRAM  [address], [facility] [phone] Patients are seen by appointment only. Walk-ins are not accepted. [place] will see patients 55 years of age and older. One Wednesday Evening (Monthly: Volunteer Based).  $30 per visit, cash only  [place]  [phone] for adults; Children under age 57, call Graduate Pediatric Dentistry at [phone]. Children aged 19-14, please call [phone] to request a pediatric application.  Dental services are provided in all areas of dental care including fillings, crowns and bridges, complete and partial dentures, implants, gum treatment, root canals, and extractions. Preventive care is also provided. Treatment is provided to both adults and children. Patients are selected via a  lottery and there is often a waiting list.   [facility] [address] [place]  [phone] [email or web address]   [facility] [address] [place], [place] [phone], Ext. [phone] Second and Fourth Thursday of each month, opens at 6:30 AM; Clinic ends at 9 AM.  Patients are seen on a first-come first-served basis, and a limited number are seen during each clinic.   [facility]  [address] Hillard Danker [employer], [place] [phone]   Eligibility Requirements You must have lived in [place], [place], or [place] counties for at least the last three months.   You cannot be eligible for state or federal sponsored  healthcare insurance, including [employer], [place], or [employer].   You generally cannot be eligible for healthcare insurance through your employer.    How to apply: Eligibility screenings are held every Tuesday and Wednesday afternoon from 1:00 pm until 4:00 pm. You do not need an appointment for the interview!  [facility] [address], [place], [place]   [place] Department  [place]  [phone]    Behavioral Health Resources in the Community: Intensive Outpatient Programs Organization         Address  Phone  Notes  [facility] [place]. [address] [place], [place] [phone]   [facility] Outpatient [address], [place], [place]   ADS: Alcohol & Drug Svcs [address] [place], [place]   [place] [address]  [place], [place] or [phone]   Substance Abuse Resources Organization         Address  Phone  Notes  Alcohol and Drug Services  [phone]   [place]  [phone]   [facility] [place]   Chinita Pester  [phone]   Residential &  Outpatient Substance Abuse Program  [phone]   Psychological Services Organization         Address  Phone  Notes  [facility]  No Name  [phone]   [place] [address] [place] or [phone]    Mobile Crisis Teams Organization         Address  Phone  Notes  [facility], Mobile Crisis Care Unit  [phone]   [facility] Psychotherapeutic Services  [address] [place], [place]   [place] [address] [place] [phone]    Self-Help/Support Groups Organization         Address  Phone             Notes  [place] - variety of support groups  [place] Call for more information  Narcotics Anonymous (NA), [facility]aring [facility] [address], [employer] [place]  2 meetings at this location   Special educational needs teacher         Address  Phone  Notes  ASAP Residential Treatment [place],    [place]  [phone]   [facility]  [address], [place] [phone], [place], [place]   [place], [place] [phone] Admissions: 8am-3pm M-F  Incentives Substance [place] [address]    [place], [place] [phone]   [facility] [address] [place], [place], [place]   [facility] [address]  [place], [place]   [facility] Programs - Intensive Outpatient [address], [place], [place]   [facility] [place].,  [place], [place] [phone] or [phone]   [facility] Treatment Services (RTS) [address], [place], [place] Accepts Medicaid  [facility] [place] [address] [place] [address],  [place] [phone] Substance Abuse/Addiction Treatment   [facility] Organization          Address  Phone  Notes  [facility]  [phone]   Domenic Schwab, PhD [address], [place], [place]   [phone]  or [phone]) [phone]   [facility]   [address] [place], [place] [phone]   [address], [place], [place] [phone] Insurance/Medicaid/sponsorship through [facility] and Families [address], [address] [place]                                    [place], [place] [phone] [place] [address]   [place], [place] [phone]    Dr. Adele Schilder  [phone]   [facility] Dept. 1) [address], [place] 2) [place] 3)  [address], [place] [phone]  [phone]   [place] [phone] or [phone] (After Hours)

## 2014-03-17 NOTE — ED Notes (Signed)
Pt reports sudden onset body aches and fever today at 1100.  Pt also reports headache and stomach cramps.

## 2014-04-20 ENCOUNTER — Emergency Department (HOSPITAL_COMMUNITY)
Admission: EM | Admit: 2014-04-20 | Discharge: 2014-04-20 | Disposition: A | Discharge status: Home / Self Care | Payer: BC Managed Care – PPO | Attending: Emergency Medicine | Admitting: Emergency Medicine

## 2014-04-20 ENCOUNTER — Encounter (HOSPITAL_COMMUNITY): Payer: Self-pay | Admitting: Emergency Medicine

## 2014-04-20 DIAGNOSIS — R109 Unspecified abdominal pain: Secondary | ICD-10-CM

## 2014-04-20 DIAGNOSIS — D573 Sickle-cell trait: Secondary | ICD-10-CM | POA: Insufficient documentation

## 2014-04-20 DIAGNOSIS — R1033 Periumbilical pain: Secondary | ICD-10-CM | POA: Insufficient documentation

## 2014-04-20 LAB — CBC WITH DIFFERENTIAL/PLATELET
Basophils Absolute: 0 10*3/uL (ref 0.0–0.1)
Basophils Relative: 0 % (ref 0–1)
EOS ABS: 0.1 10*3/uL (ref 0.0–0.7)
Eosinophils Relative: 1 % (ref 0–5)
HCT: 52.8 % — ABNORMAL HIGH (ref 39.0–52.0)
HEMOGLOBIN: 17.7 g/dL — AB (ref 13.0–17.0)
LYMPHS ABS: 3.3 10*3/uL (ref 0.7–4.0)
LYMPHS PCT: 40 % (ref 12–46)
MCH: 27.3 pg (ref 26.0–34.0)
MCHC: 33.5 g/dL (ref 30.0–36.0)
MCV: 81.4 fL (ref 78.0–100.0)
MONOS PCT: 7 % (ref 3–12)
Monocytes Absolute: 0.6 10*3/uL (ref 0.1–1.0)
NEUTROS PCT: 52 % (ref 43–77)
Neutro Abs: 4.2 10*3/uL (ref 1.7–7.7)
PLATELETS: 163 10*3/uL (ref 150–400)
RBC: 6.49 MIL/uL — AB (ref 4.22–5.81)
RDW: 14.5 % (ref 11.5–15.5)
WBC: 8.1 10*3/uL (ref 4.0–10.5)

## 2014-04-20 LAB — URINALYSIS, ROUTINE W REFLEX MICROSCOPIC
BILIRUBIN URINE: NEGATIVE
Glucose, UA: NEGATIVE mg/dL
HGB URINE DIPSTICK: NEGATIVE
KETONES UR: NEGATIVE mg/dL
Leukocytes, UA: NEGATIVE
NITRITE: NEGATIVE
PROTEIN: NEGATIVE mg/dL
Specific Gravity, Urine: 1.014 (ref 1.005–1.030)
UROBILINOGEN UA: 0.2 mg/dL (ref 0.0–1.0)
pH: 6 (ref 5.0–8.0)

## 2014-04-20 LAB — COMPREHENSIVE METABOLIC PANEL
ALK PHOS: 54 U/L (ref 39–117)
ALT: 14 U/L (ref 0–53)
AST: 22 U/L (ref 0–37)
Albumin: 4.3 g/dL (ref 3.5–5.2)
BILIRUBIN TOTAL: 0.7 mg/dL (ref 0.3–1.2)
BUN: 13 mg/dL (ref 6–23)
CO2: 26 meq/L (ref 19–32)
Calcium: 9.3 mg/dL (ref 8.4–10.5)
Chloride: 101 mEq/L (ref 96–112)
Creatinine, Ser: 0.97 mg/dL (ref 0.50–1.35)
GLUCOSE: 110 mg/dL — AB (ref 70–99)
POTASSIUM: 3.7 meq/L (ref 3.7–5.3)
SODIUM: 143 meq/L (ref 137–147)
TOTAL PROTEIN: 7.3 g/dL (ref 6.0–8.3)

## 2014-04-20 LAB — LIPASE, BLOOD: LIPASE: 22 U/L (ref 11–59)

## 2014-04-20 MED ORDER — SODIUM CHLORIDE 0.9 % IV SOLN
Freq: Once | INTRAVENOUS | Status: AC
  Administered 2014-04-20: 04:00:00 via INTRAVENOUS

## 2014-04-20 MED ORDER — HYDROMORPHONE HCL PF 1 MG/ML IJ SOLN: 1.0000 mg | Freq: Once | INTRAMUSCULAR | Status: DC

## 2014-04-20 MED ORDER — GI COCKTAIL ~~LOC~~
30.0000 mL | Freq: Once | ORAL | Status: AC
  Administered 2014-04-20: 30 mL via ORAL
  Filled 2014-04-20: qty 30

## 2014-04-20 MED ORDER — MORPHINE SULFATE 4 MG/ML IJ SOLN
4.0000 mg | INTRAMUSCULAR | Status: DC | PRN
  Administered 2014-04-20: 4 mg via INTRAVENOUS
  Filled 2014-04-20: qty 1

## 2014-04-20 MED ORDER — SUCRALFATE 1 G PO TABS: 1.0000 g | ORAL_TABLET | Freq: Four times a day (QID) | ORAL | Status: DC

## 2014-04-20 MED ORDER — ONDANSETRON HCL 4 MG/2ML IJ SOLN
4.0000 mg | Freq: Once | INTRAMUSCULAR | Status: AC
  Administered 2014-04-20: 4 mg via INTRAVENOUS
  Filled 2014-04-20: qty 2

## 2014-04-20 MED ORDER — PANTOPRAZOLE SODIUM 20 MG PO TBEC: 40.0000 mg | DELAYED_RELEASE_TABLET | Freq: Every day | ORAL | Status: DC

## 2014-04-20 NOTE — ED Notes (Signed)
Per EMS pt was woken up from sleep w/ 10/10 sudden onset of abdominal pain, more tender around umbilical area but tender all over on palpitation, pt is a little nauseous denies vomiting or diarrhea, 100 mcg fentanyl given by EMS, 18G LFA.

## 2014-04-20 NOTE — Discharge Instructions (Signed)
Abdominal Pain, Adult °Many things can cause abdominal pain. Usually, abdominal pain is not caused by a disease and will improve without treatment. It can often be observed and treated at home. Your health care provider will do a physical exam and possibly order blood tests and X-rays to help determine the seriousness of your pain. However, in many cases, more time must pass before a clear cause of the pain can be found. Before that point, your health care provider may not know if you need more testing or further treatment. °HOME CARE INSTRUCTIONS  °Monitor your abdominal pain for any changes. The following actions may help to alleviate any discomfort you are experiencing: °· Only take over-the-counter or prescription medicines as directed by your health care provider. °· Do not take laxatives unless directed to do so by your health care provider. °· Try a clear liquid diet (broth, tea, or water) as directed by your health care provider. Slowly move to a bland diet as tolerated. °SEEK MEDICAL CARE IF: °· You have unexplained abdominal pain. °· You have abdominal pain associated with nausea or diarrhea. °· You have pain when you urinate or have a bowel movement. °· You experience abdominal pain that wakes you in the night. °· You have abdominal pain that is worsened or improved by eating food. °· You have abdominal pain that is worsened with eating fatty foods. °SEEK IMMEDIATE MEDICAL CARE IF:  °· Your pain does not go away within 2 hours. °· You have a fever. °· You keep throwing up (vomiting). °· Your pain is felt only in portions of the abdomen, such as the right side or the left lower portion of the abdomen. °· You pass bloody or black tarry stools. °MAKE SURE YOU: °· Understand these instructions.   °· Will watch your condition.   °· Will get help right away if you are not doing well or get worse.   °Document Released: 08/05/2005 Document Revised: 08/16/2013 Document Reviewed: 07/05/2013 °ExitCare® Patient  Information ©2014 [employer]. ° °

## 2014-04-20 NOTE — ED Provider Notes (Signed)
CSN: [id number]     Arrival date & time 04/20/14  0327 History   First MD Initiated Contact with Patient 04/20/14 0345     Chief Complaint  Patient presents with  . Abdominal Pain     (Consider location/radiation/quality/duration/timing/severity/associated sxs/prior Treatment) HPI 18 year old male presents to emergency department with complaint of abdominal pain.  He should reports pain woke him from sleep around 3 AM.  Pain is 10 at 10 sharp stabbing at his umbilicus.  He denies any nausea vomiting or diarrhea.  Patient had fish, potato salad greens for dinner.  No sick contacts no recent travel.  No prior history of abdominal surgery or medical issues.  Patient received fentanyl in route from EMS. Past Medical History  Diagnosis Date  . Sickle cell trait    History reviewed. No pertinent past surgical history. No family history on file. History  Substance Use Topics  . Smoking status: Never Smoker   . Smokeless tobacco: Not on file  . Alcohol Use: Yes    Review of Systems   See History of Present Illness; otherwise all other systems are reviewed and negative  Allergies  Review of patient's allergies indicates no known allergies.  Home Medications   Prior to Admission medications   Not on File   BP 135/91  Pulse 57  Temp(Src) 97.7 F (36.5 C) (Oral)  Resp 18  SpO2 99% Physical Exam  Nursing note and vitals reviewed. Constitutional: He is oriented to person, place, and time. He appears well-developed and well-nourished. He appears distressed (uncomfortable appearing male).  HENT:  Head: Normocephalic and atraumatic.  Nose: Nose normal.  Mouth/Throat: Oropharynx is clear and moist.  Eyes: Conjunctivae and EOM are normal. Pupils are equal, round, and reactive to light.  Neck: Normal range of motion. Neck supple. No JVD present. No tracheal deviation present. No thyromegaly present.  Cardiovascular: Normal rate, regular rhythm, normal heart sounds and intact distal  pulses.  Exam reveals no gallop and no friction rub.   No murmur heard. Pulmonary/Chest: Effort normal and breath sounds normal. No stridor. No respiratory distress. He has no wheezes. He has no rales. He exhibits no tenderness.  Abdominal: Soft. Bowel sounds are normal. He exhibits no distension and no mass. There is tenderness (patient has diffuse tenderness worse around the umbilicus.  No pain at McBurney's point or Murphy's sign). There is no rebound and no guarding.  Musculoskeletal: Normal range of motion. He exhibits no edema and no tenderness.  Lymphadenopathy:    He has no cervical adenopathy.  Neurological: He is alert and oriented to person, place, and time. No cranial nerve deficit. He exhibits normal muscle tone. Coordination normal.  Skin: Skin is dry. No rash noted. No erythema. No pallor.  Psychiatric: He has a normal mood and affect. His behavior is normal. Judgment and thought content normal.    ED Course  Procedures (including critical care time) Labs Review Labs Reviewed  CBC WITH DIFFERENTIAL - Abnormal; Notable for the following:    RBC 6.49 (*)    Hemoglobin 17.7 (*)    HCT 52.8 (*)    All other components within normal limits  COMPREHENSIVE METABOLIC PANEL - Abnormal; Notable for the following:    Glucose, Bld 110 (*)    All other components within normal limits  LIPASE, BLOOD  URINALYSIS, ROUTINE W REFLEX MICROSCOPIC    Imaging Review No results found.   EKG Interpretation None      MDM   Final diagnoses:  Abdominal pain  18 year old male with acute onset of abdominal pain.  Differential includes kidney stone, gastroenteritis, early appendicitis.  Plan for labs, fluids pain and nausea medicine.  6:04 AM Labs normal.  Pt feeling better after GI cocktail.  Will d/c home to f/u with PPI, f/u with pcm.  Kalman Drape, MD 04/20/14 [phone]

## 2014-04-20 NOTE — ED Notes (Signed)
Bed: WA03 Expected date:  Expected time:  Means of arrival:  Comments: EMS 18yo M abd pain no N/V

## 2014-06-26 ENCOUNTER — Encounter (HOSPITAL_COMMUNITY): Payer: Self-pay | Admitting: Emergency Medicine

## 2014-06-26 ENCOUNTER — Emergency Department (HOSPITAL_COMMUNITY)
Admission: EM | Admit: 2014-06-26 | Discharge: 2014-06-26 | Disposition: A | Discharge status: Home / Self Care | Payer: BC Managed Care – PPO | Attending: Emergency Medicine | Admitting: Emergency Medicine

## 2014-06-26 DIAGNOSIS — R197 Diarrhea, unspecified: Secondary | ICD-10-CM

## 2014-06-26 DIAGNOSIS — K589 Irritable bowel syndrome without diarrhea: Secondary | ICD-10-CM | POA: Insufficient documentation

## 2014-06-26 DIAGNOSIS — K625 Hemorrhage of anus and rectum: Secondary | ICD-10-CM | POA: Insufficient documentation

## 2014-06-26 DIAGNOSIS — Z862 Personal history of diseases of the blood and blood-forming organs and certain disorders involving the immune mechanism: Secondary | ICD-10-CM | POA: Insufficient documentation

## 2014-06-26 DIAGNOSIS — R109 Unspecified abdominal pain: Secondary | ICD-10-CM

## 2014-06-26 LAB — COMPREHENSIVE METABOLIC PANEL
ALT: 16 U/L (ref 0–53)
AST: 19 U/L (ref 0–37)
Albumin: 3.9 g/dL (ref 3.5–5.2)
Alkaline Phosphatase: 69 U/L (ref 39–117)
Anion gap: 11 (ref 5–15)
BUN: 11 mg/dL (ref 6–23)
CO2: 28 mEq/L (ref 19–32)
Calcium: 9.4 mg/dL (ref 8.4–10.5)
Chloride: 101 mEq/L (ref 96–112)
Creatinine, Ser: 1.25 mg/dL (ref 0.50–1.35)
GFR calc Af Amer: >90 mL/min (ref >90)
GFR calc non Af Amer: 82 mL/min — ABNORMAL LOW (ref >90)
Glucose, Bld: 65 mg/dL — ABNORMAL LOW (ref 70–99)
Potassium: 4 mEq/L (ref 3.7–5.3)
Sodium: 140 mEq/L (ref 137–147)
Total Bilirubin: 0.5 mg/dL (ref 0.3–1.2)
Total Protein: 7.3 g/dL (ref 6.0–8.3)

## 2014-06-26 LAB — CBC WITH DIFFERENTIAL/PLATELET
Basophils Absolute: 0 10*3/uL (ref 0.0–0.1)
Basophils Relative: 0 % (ref 0–1)
Eosinophils Absolute: 0 10*3/uL (ref 0.0–0.7)
Eosinophils Relative: 0 % (ref 0–5)
HCT: 54.8 % — ABNORMAL HIGH (ref 39.0–52.0)
Hemoglobin: 19.4 g/dL — ABNORMAL HIGH (ref 13.0–17.0)
Lymphocytes Relative: 33 % (ref 12–46)
Lymphs Abs: 1.6 10*3/uL (ref 0.7–4.0)
MCH: 28 pg (ref 26.0–34.0)
MCHC: 35.4 g/dL (ref 30.0–36.0)
MCV: 79.1 fL (ref 78.0–100.0)
Monocytes Absolute: 0.4 10*3/uL (ref 0.1–1.0)
Monocytes Relative: 9 % (ref 3–12)
Neutro Abs: 2.7 10*3/uL (ref 1.7–7.7)
Neutrophils Relative %: 58 % (ref 43–77)
Platelets: 165 10*3/uL (ref 150–400)
RBC: 6.93 MIL/uL — ABNORMAL HIGH (ref 4.22–5.81)
RDW: 14.7 % (ref 11.5–15.5)
WBC: 4.7 10*3/uL (ref 4.0–10.5)

## 2014-06-26 LAB — POC OCCULT BLOOD, ED: Fecal Occult Bld: NEGATIVE

## 2014-06-26 LAB — LIPASE, BLOOD: Lipase: 27 U/L (ref 11–59)

## 2014-06-26 MED ORDER — DICYCLOMINE HCL 10 MG PO CAPS
10.0000 mg | ORAL_CAPSULE | Freq: Once | ORAL | Status: AC
  Administered 2014-06-26: 10 mg via ORAL
  Filled 2014-06-26: qty 1

## 2014-06-26 MED ORDER — DICYCLOMINE HCL 10 MG PO CAPS: 20.0000 mg | ORAL_CAPSULE | Freq: Three times a day (TID) | ORAL | Status: DC

## 2014-06-26 NOTE — ED Notes (Signed)
Pt alert, nad, resp even unlabored, skin pwd, denies needs, ambulates to discharge

## 2014-06-26 NOTE — ED Notes (Signed)
Patient states he has been having diarrhea x 1 week and today he noticed that he had dark red liquid stool x 1. Patient also reports that he has had 4 episodes of abdominal pain and diarrhea.

## 2014-06-26 NOTE — ED Provider Notes (Signed)
Medical screening examination/treatment/procedure(s) were performed by non-physician practitioner and as supervising physician I was immediately available for consultation/collaboration.   EKG Interpretation None        Evelina Bucy, MD 06/26/14 [phone]

## 2014-06-26 NOTE — ED Provider Notes (Signed)
CSN: [id number]     Arrival date & time 06/26/14  1621 History   First MD Initiated Contact with Patient 06/26/14 1643     Chief Complaint  Patient presents with  . Rectal Bleeding  . Abdominal Pain     (Consider location/radiation/quality/duration/timing/severity/associated sxs/prior Treatment) HPI Pt is a 18yo male presenting to ED with c/o diffuse intermittent abdominal pain with associated with loose, watery BMs x 1 week, about 4-5 episodes a day w/o mucous but states today he noticed 1 BM of dark red liquid once around 3PM this afternoon which caused pt to come to ED for further evaluation. Reports hx of similar symptoms 3 other times this year and is concerned as he states he does not believe he should have a stomach virus 4 times in one year. Pt does state last night he took a laxative to help with his diarrhea but states symptoms seemed to have worsened.  Denies fever, nausea or vomiting. Denies sick contacts or recent travel. Denies urinary symptoms. Denies hx of abdominal surgery. Denies abdominal pain at this time.   Past Medical History  Diagnosis Date  . Sickle cell trait    History reviewed. No pertinent past surgical history. Family History  Problem Relation Age of Onset  . Hypertension Mother    History  Substance Use Topics  . Smoking status: Never Smoker   . Smokeless tobacco: Never Used  . Alcohol Use: Yes     Comment: weekends only    Review of Systems  Constitutional: Negative for fever and chills.  Cardiovascular: Negative for chest pain and palpitations.  Gastrointestinal: Positive for abdominal pain, diarrhea and blood in stool. Negative for nausea, vomiting and constipation.  Genitourinary: Negative for dysuria, frequency, hematuria, flank pain, decreased urine volume and testicular pain.  Musculoskeletal: Negative for back pain and myalgias.  All other systems reviewed and are negative.     Allergies  Review of patient's allergies indicates no  known allergies.  Home Medications   Prior to Admission medications   Medication Sig Start Date End Date Taking? Authorizing Provider  Bisacodyl (LAXATIVE PO) Take 2 tablets by mouth once as needed.   Yes Historical Provider, MD  dicyclomine (BENTYL) 10 MG capsule Take 2 capsules (20 mg total) by mouth 4 (four) times daily -  before meals and at bedtime. 06/26/14   Noland Fordyce, PA-C   BP 148/78  Pulse 60  Temp(Src) 98.2 F (36.8 C) (Oral)  Resp 16  Ht 6\' 2"  (1.88 m)  Wt 221 lb 9.6 oz (100.517 kg)  BMI 28.44 kg/m2  SpO2 99% Physical Exam  Nursing note and vitals reviewed. Constitutional: He appears well-developed and well-nourished. No distress.  Pt sitting comfortably on side of exam bed, NAD.   HENT:  Head: Normocephalic and atraumatic.  Eyes: Conjunctivae are normal. No scleral icterus.  Neck: Normal range of motion.  Cardiovascular: Normal rate, regular rhythm and normal heart sounds.   Regular rate and rhythm  Pulmonary/Chest: Effort normal and breath sounds normal. No respiratory distress. He has no wheezes. He has no rales. He exhibits no tenderness.  No respiratory distress, able to speak in full sentences w/o difficulty. Lungs: CTAB  Abdominal: Soft. Bowel sounds are normal. He exhibits no distension and no mass. There is no tenderness. There is no rebound and no guarding.  Soft, non-distended, non-tender. No CVAT   Genitourinary:  Chaperoned exam. Rectal exam: no gross blood, soft brown stool on glove.   Musculoskeletal: Normal range of motion.  Neurological: He is alert.  Skin: Skin is warm and dry. He is not diaphoretic.    ED Course  Procedures (including critical care time) Labs Review Labs Reviewed  CBC WITH DIFFERENTIAL - Abnormal; Notable for the following:    RBC 6.93 (*)    Hemoglobin 19.4 (*)    HCT 54.8 (*)    All other components within normal limits  COMPREHENSIVE METABOLIC PANEL - Abnormal; Notable for the following:    Glucose, Bld 65 (*)     GFR calc non Af Amer 82 (*)    All other components within normal limits  LIPASE, BLOOD  POC OCCULT BLOOD, ED    Imaging Review No results found.   EKG Interpretation None      MDM   Final diagnoses:  Diarrhea  Abdominal cramping  IBS (irritable bowel syndrome)    Pt is a 18yo male presenting to ED with c/o abdominal cramping associated with diarrhea and 1 episodes of blood diarrhea. Pt reports hx of similar symptoms at least 3 times this year.  States he tried a laxative last night but states it made things worse.   On exam, pt appears non-toxic, is afebrile, NAD. Abdominal exam: benign. Hemoccult negative. CBC: consistent with previous. CMP: unremarkable.  Not concerned for emergent process taking place at this time including diverticulitis, SBO, appendicitis, or cholecystitis.  As pt reports hx of similar symptoms, will tx pt symptomatically for IBS. Advised pt imaging would not be of benefit at this time.    Discussed difference between laxative and antidiarrheal medications.  Will discharge pt home with Bentyl as well as advised pt may use OTC imodium if he runs out of Bentyl and cannot f/u with PCP or GI.  Provided pt info packet on diet for IBS. Instructed pt to f/u with [facility] if symptoms not improving. Return precautions provided. Pt verbalized understanding and agreement with tx plan.     Noland Fordyce, PA-C 06/26/14 1944

## 2014-06-27 ENCOUNTER — Telehealth (HOSPITAL_BASED_OUTPATIENT_CLINIC_OR_DEPARTMENT_OTHER): Payer: Self-pay

## 2014-06-29 ENCOUNTER — Ambulatory Visit: Payer: BC Managed Care – PPO | Attending: Internal Medicine

## 2014-08-16 ENCOUNTER — Encounter (HOSPITAL_COMMUNITY): Payer: Self-pay | Admitting: Emergency Medicine

## 2014-08-16 ENCOUNTER — Emergency Department (HOSPITAL_COMMUNITY)
Admission: EM | Admit: 2014-08-16 | Discharge: 2014-08-17 | Disposition: A | Discharge status: Home / Self Care | Payer: Self-pay | Attending: Emergency Medicine | Admitting: Emergency Medicine

## 2014-08-16 DIAGNOSIS — R1084 Generalized abdominal pain: Secondary | ICD-10-CM | POA: Insufficient documentation

## 2014-08-16 DIAGNOSIS — R110 Nausea: Secondary | ICD-10-CM | POA: Insufficient documentation

## 2014-08-16 DIAGNOSIS — R109 Unspecified abdominal pain: Secondary | ICD-10-CM

## 2014-08-16 DIAGNOSIS — Z79899 Other long term (current) drug therapy: Secondary | ICD-10-CM | POA: Insufficient documentation

## 2014-08-16 DIAGNOSIS — K5900 Constipation, unspecified: Secondary | ICD-10-CM | POA: Insufficient documentation

## 2014-08-16 DIAGNOSIS — G8929 Other chronic pain: Secondary | ICD-10-CM | POA: Insufficient documentation

## 2014-08-16 DIAGNOSIS — Z862 Personal history of diseases of the blood and blood-forming organs and certain disorders involving the immune mechanism: Secondary | ICD-10-CM | POA: Insufficient documentation

## 2014-08-16 LAB — CBC WITH DIFFERENTIAL/PLATELET
Basophils Absolute: 0 10*3/uL (ref 0.0–0.1)
Basophils Relative: 0 % (ref 0–1)
Eosinophils Absolute: 0 10*3/uL (ref 0.0–0.7)
Eosinophils Relative: 0 % (ref 0–5)
HCT: 54.2 % — ABNORMAL HIGH (ref 39.0–52.0)
Hemoglobin: 18.9 g/dL — ABNORMAL HIGH (ref 13.0–17.0)
Lymphocytes Relative: 20 % (ref 12–46)
Lymphs Abs: 1.6 10*3/uL (ref 0.7–4.0)
MCH: 27.6 pg (ref 26.0–34.0)
MCHC: 34.9 g/dL (ref 30.0–36.0)
MCV: 79.2 fL (ref 78.0–100.0)
Monocytes Absolute: 0.6 10*3/uL (ref 0.1–1.0)
Monocytes Relative: 7 % (ref 3–12)
Neutro Abs: 5.9 10*3/uL (ref 1.7–7.7)
Neutrophils Relative %: 73 % (ref 43–77)
Platelets: 181 10*3/uL (ref 150–400)
RBC: 6.84 MIL/uL — ABNORMAL HIGH (ref 4.22–5.81)
RDW: 14.6 % (ref 11.5–15.5)
WBC: 8.1 10*3/uL (ref 4.0–10.5)

## 2014-08-16 LAB — COMPREHENSIVE METABOLIC PANEL
ALT: 17 U/L (ref 0–53)
AST: 21 U/L (ref 0–37)
Albumin: 4.3 g/dL (ref 3.5–5.2)
Alkaline Phosphatase: 68 U/L (ref 39–117)
Anion gap: 12 (ref 5–15)
BUN: 11 mg/dL (ref 6–23)
CO2: 26 mEq/L (ref 19–32)
Calcium: 9.1 mg/dL (ref 8.4–10.5)
Chloride: 104 mEq/L (ref 96–112)
Creatinine, Ser: 1.1 mg/dL (ref 0.50–1.35)
GFR calc Af Amer: >90 mL/min (ref >90)
GFR calc non Af Amer: >90 mL/min (ref >90)
Glucose, Bld: 84 mg/dL (ref 70–99)
Potassium: 4.1 mEq/L (ref 3.7–5.3)
Sodium: 142 mEq/L (ref 137–147)
Total Bilirubin: 0.6 mg/dL (ref 0.3–1.2)
Total Protein: 7.8 g/dL (ref 6.0–8.3)

## 2014-08-16 MED ORDER — GI COCKTAIL ~~LOC~~
30.0000 mL | Freq: Once | ORAL | Status: AC
  Administered 2014-08-16: 30 mL via ORAL
  Filled 2014-08-16: qty 30

## 2014-08-16 MED ORDER — DICYCLOMINE HCL 10 MG/ML IM SOLN
20.0000 mg | Freq: Once | INTRAMUSCULAR | Status: AC
  Administered 2014-08-16: 20 mg via INTRAMUSCULAR
  Filled 2014-08-16: qty 2

## 2014-08-16 MED ORDER — KETOROLAC TROMETHAMINE 60 MG/2ML IM SOLN
60.0000 mg | Freq: Once | INTRAMUSCULAR | Status: AC
  Administered 2014-08-16: 60 mg via INTRAMUSCULAR
  Filled 2014-08-16: qty 2

## 2014-08-16 NOTE — ED Provider Notes (Signed)
CSN: [id number]     Arrival date & time 08/16/14  2143 History   First MD Initiated Contact with Patient 08/16/14 2226     Chief Complaint  Patient presents with  . Abdominal Pain  . Nausea     (Consider location/radiation/quality/duration/timing/severity/associated sxs/prior Treatment) HPI Joe Vang is a 18 y.o. male with a history of chronic intermittent abdominal pain comes in for evaluation of abdominal pain. Patient states this problem began in January it has been off-and-on since then. He does not follow with primary care or a GI specialist. He states he always comes to the ED "they giving me medicine and I feel better and i go home". He reports his abdominal discomfort as a generalized crampiness that is relieved when he has a bowel movement. He reports having been constipated for the past 2 days. He also reports associated nausea without vomiting. He has not tried any medications to relieve his problem. There are no other relieving or aggravating factors. He denies fevers, diarrhea, chest pain, shortness of breath, numbness or weakness, sick contacts.   Past Medical History  Diagnosis Date  . Sickle cell trait    History reviewed. No pertinent past surgical history. Family History  Problem Relation Age of Onset  . Hypertension Mother    History  Substance Use Topics  . Smoking status: Never Smoker   . Smokeless tobacco: Never Used  . Alcohol Use: Yes     Comment: weekends only    Review of Systems  Constitutional: Negative for fever.  HENT: Negative for sore throat.   Eyes: Negative for visual disturbance.  Respiratory: Negative for shortness of breath.   Cardiovascular: Negative for chest pain.  Gastrointestinal: Positive for nausea and abdominal pain.  Endocrine: Negative for polyuria.  Genitourinary: Negative for dysuria.  Skin: Negative for rash.  Neurological: Negative for headaches.      Allergies  Review of patient's allergies indicates no known  allergies.  Home Medications   Prior to Admission medications   Medication Sig Start Date End Date Taking? Authorizing Provider  Bisacodyl (LAXATIVE PO) Take 2 tablets by mouth once as needed.    Historical Provider, MD  dicyclomine (BENTYL) 10 MG capsule Take 2 capsules (20 mg total) by mouth 4 (four) times daily -  before meals and at bedtime. 06/26/14   Noland Fordyce, PA-C   BP 152/101  Pulse 75  Temp(Src) 98.7 F (37.1 C) (Oral)  Resp 18  SpO2 100% Physical Exam  Nursing note and vitals reviewed. Constitutional: He is oriented to person, place, and time. He appears well-developed and well-nourished.  HENT:  Head: Normocephalic and atraumatic.  Mouth/Throat: Oropharynx is clear and moist.  Eyes: Conjunctivae are normal. Pupils are equal, round, and reactive to light. Right eye exhibits no discharge. Left eye exhibits no discharge. No scleral icterus.  Neck: Neck supple.  Cardiovascular: Normal rate, regular rhythm and normal heart sounds.   Pulmonary/Chest: Effort normal and breath sounds normal. No respiratory distress. He has no wheezes. He has no rales.  Abdominal: Soft.  Diffuse R sided tenderness. Negative McBurneys and Murphy's. No distension, rashes or masses appreciated  Musculoskeletal: He exhibits no tenderness.  Neurological: He is alert and oriented to person, place, and time.  Cranial Nerves II-XII grossly intact  Skin: Skin is warm and dry. No rash noted.  Psychiatric: He has a normal mood and affect.    ED Course  Procedures (including critical care time) Labs Review Labs Reviewed  CBC WITH DIFFERENTIAL - Abnormal;  Notable for the following:    RBC 6.84 (*)    Hemoglobin 18.9 (*)    HCT 54.2 (*)    All other components within normal limits  COMPREHENSIVE METABOLIC PANEL  URINALYSIS, ROUTINE W REFLEX MICROSCOPIC    Imaging Review No results found.   EKG Interpretation None     Meds given in ED:  Medications  ketorolac (TORADOL) injection 60 mg  (60 mg Intramuscular Given 08/16/14 2313)  dicyclomine (BENTYL) injection 20 mg (20 mg Intramuscular Given 08/16/14 2312)  gi cocktail (Maalox,Lidocaine,Donnatal) (30 mLs Oral Given 08/16/14 2347)    New Prescriptions   DICYCLOMINE (BENTYL) 20 MG TABLET    Take 1 tablet (20 mg total) by mouth 2 (two) times daily.   ONDANSETRON (ZOFRAN ODT) 4 MG DISINTEGRATING TABLET    4mg  ODT q4 hours prn nausea/vomit   Filed Vitals:   08/16/14 2149  BP: 152/101  Pulse: 75  Temp: 98.7 F (37.1 C)  TempSrc: Oral  Resp: 18  SpO2: 100%    MDM  Vitals stable - WNL -afebrile Pt resting comfortably in ED. says he feels much better after Bentyl and GI cocktail. PE shows no evidence of acute or surgical abdomen. No McBurney's or Murphy's sign present. Low suspicion for other emergent intra-abdominal pathology Labwork essentially normal for the patient and noncontributory I feel patient's symptomology is likely due to to IBS given his discomfort is relieved with defecation. Will DC with Bentyl for abdominal cramps and Zofran for intermittent nausea. Patient reports he has laxatives at home he can take. Discussed f/u with PCP and return precautions, pt very amenable to plan. Patient stable, appears nontoxic, is in good condition and appropriate for discharge.  Prior to patient discharge, I discussed and reviewed this case with Dr.Kohut    Final diagnoses:  Abdominal discomfort        Verl Dicker, PA-C 08/18/14 0002

## 2014-08-16 NOTE — ED Notes (Signed)
Pt presents with c/o abdominal pain that started earlier today. Pt reports nausea with no vomiting and diarrhea as well.

## 2014-08-16 NOTE — ED Notes (Signed)
Pt unable to void at this time. 

## 2014-08-17 MED ORDER — DICYCLOMINE HCL 20 MG PO TABS: 20.0000 mg | ORAL_TABLET | Freq: Two times a day (BID) | ORAL | Status: DC

## 2014-08-17 MED ORDER — ONDANSETRON 4 MG PO TBDP
ORAL_TABLET | ORAL | Status: DC
Start: 2014-08-17 — End: 2016-07-09

## 2014-08-17 NOTE — Discharge Instructions (Signed)
Your evaluation the ED today showed no emergent source for abdominal pain. Please return for further evaluation if your abdominal pain increases in severity, he experienced fevers, bloody stool, increased nausea and vomiting, loss of appetite.

## 2014-08-18 NOTE — ED Provider Notes (Signed)
Medical screening examination/treatment/procedure(s) were performed by non-physician practitioner and as supervising physician I was immediately available for consultation/collaboration.   EKG Interpretation None       Virgel Manifold, MD 08/18/14 1535

## 2014-08-23 ENCOUNTER — Encounter: Payer: Self-pay | Admitting: Family

## 2014-08-23 ENCOUNTER — Ambulatory Visit (INDEPENDENT_AMBULATORY_CARE_PROVIDER_SITE_OTHER): Payer: Self-pay | Admitting: Family

## 2014-08-23 VITALS — BP 188/120 | HR 56 | Temp 98.1°F | Resp 18 | Ht 73.0 in | Wt 214.1 lb

## 2014-08-23 DIAGNOSIS — Z23 Encounter for immunization: Secondary | ICD-10-CM

## 2014-08-23 DIAGNOSIS — K589 Irritable bowel syndrome without diarrhea: Secondary | ICD-10-CM

## 2014-08-23 DIAGNOSIS — B353 Tinea pedis: Secondary | ICD-10-CM

## 2014-08-23 MED ORDER — NAFTIFINE HCL 1 % EX CREA: TOPICAL_CREAM | Freq: Every day | CUTANEOUS | Status: DC

## 2014-08-23 NOTE — Progress Notes (Signed)
Pre visit review using our clinic review tool, if applicable. No additional management support is needed unless otherwise documented below in the visit note. 

## 2014-08-23 NOTE — Progress Notes (Signed)
   Subjective:    Patient ID: Joe Vang, male    DOB: 15-Nov-1995, 17 y.o.   MRN: [id number]  HPI:  [name] is a 18 y.o. male who presents today to establish care and discuss stomach issues.   1) Stomach issue - stomach spasms and cramps since January 2015. Started with a stomach virus - symptoms diarrhea and nausea which lasted about 2 weeks; bowel movements remained diarrhea and multiple episodes of stomach spasms have occurred since. Spasms are usually relieved when he has a bowel movement. Unaware of any trigger foods. He has been treated in the emergency room with Bentyl and a GI cocktail. He has not started his prescription of Bentyl at this time. Denies heartburn.   2) Tinea Pedis - reports tinea pedis that has been resistant to over the counter medications that has been going on for a couple of weeks now. Describes cracking skin between toes.   Review of Systems  See HPI    Objective:     BP 188/120  Pulse 56  Temp(Src) 98.1 F (36.7 C) (Oral)  Resp 18  Ht 6\' 1"  (1.854 m)  Wt 214 lb 2.2 oz (97.133 kg)  BMI 28.26 kg/m2  SpO2 97% Nursing note and vital signs reviewed. Blood pressure re-take - 138/88  Physical Exam  Constitutional: He is oriented to person, place, and time. He appears well-developed and well-nourished.  Cardiovascular: Normal rate, regular rhythm and normal heart sounds.   Pulmonary/Chest: Effort normal and breath sounds normal.  Abdominal: Soft. Normal appearance and bowel sounds are normal. He exhibits no distension, no abdominal bruit, no ascites, no pulsatile midline mass and no mass. There is no hepatosplenomegaly. There is no tenderness. There is no rigidity, no rebound, no guarding, no CVA tenderness, no tenderness at McBurney's point and negative Murphy's sign.  Musculoskeletal:  Bilateral feet with tinea pedis present.  Neurological: He is alert and oriented to person, place, and time.  Skin: Skin is warm and dry.  Psychiatric: He  has a normal mood and affect. His behavior is normal. Judgment and thought content normal.        Assessment & Plan:

## 2014-08-23 NOTE — Assessment & Plan Note (Signed)
Appearance consistent with tinea pedis. Start Naftin x 4 weeks.

## 2014-08-23 NOTE — Patient Instructions (Signed)
Thank you for choosing [facility].  Summary/Instructions:   Your prescription has been sent to your pharmacy  I have referred you to gastroenterology - you should be hearing back from [place] within a week  Start taking the Bentyl as prescribed  You may use over the counter diarrhea relief as needed including Immodium   Thank you for enrolling in [facility]. Please follow the instructions below to securely access your online medical record. MyChart allows you to send messages to your doctor, view your test results, renew your prescriptions, schedule appointments, and more.  How Do I Sign Up? 1. In your Internet browser, go to [email or web address]. 2. Click on the New  User? link in the Sign In box.  3. Enter your MyChart Access Code exactly as it appears below. You will not need to use this code after you have completed the sign-up process. If you do not sign up before the expiration date, you must request a new code. MyChart Access Code: [email or web address] Expires: 08/25/2014  6:09 PM  4. Enter the last four digits of your Social Security Number (xxxx) and Date of Birth (mm/dd/yyyy) as indicated and click Next. You will be taken to the next sign-up page. 5. Create a MyChart ID. This will be your MyChart login ID and cannot be changed, so think of one that is secure and easy to remember. 6. Create a MyChart password. You can change your password at any time. 7. Enter your Password Reset Question and Answer and click Next. This can be used at a later time if you forget your password.  8. Select your communication preference, and if applicable enter your e-mail address. You will receive e-mail notification when new information is available in MyChart by choosing to receive e-mail notifications and filling in your e-mail. 9. Click Sign In. You can now view your medical record.   Additional Information If you have questions, you can email [email or web address] or  call [phone] to talk to our [place] staff. Remember, MyChart is NOT to be used for urgent needs. For medical emergencies, dial 911.

## 2014-08-23 NOTE — Assessment & Plan Note (Signed)
Not experiencing and cramping now. Instructed to start the Bentyl as prescribed. Will record potential food/drink that triggers spasm. Use OTC loperamide for diarrhea. Discussed using a probiotic to assist with digestion. Will refer to gastroenterology for assistance with maintenance.

## 2014-08-28 ENCOUNTER — Encounter: Payer: Self-pay | Admitting: Internal Medicine

## 2014-08-30 ENCOUNTER — Telehealth: Payer: Self-pay | Admitting: Family

## 2014-08-30 NOTE — Telephone Encounter (Signed)
Patient states he had spoke to [facility] about dry scalp and bumps.  He would like a call back to see if he could get something prescribed.

## 2014-08-30 NOTE — Telephone Encounter (Signed)
I don't recall a conversation for dry scalp or bumps - we discussed his feet and irritable bowels. If he has a dry scalp the first recommendation would be over the counter shampoos such as [employer].

## 2014-08-31 NOTE — Telephone Encounter (Signed)
Left phone vm for patient to call back to make appt or to try selsun blue for dry scalp.

## 2014-09-06 ENCOUNTER — Ambulatory Visit (INDEPENDENT_AMBULATORY_CARE_PROVIDER_SITE_OTHER): Payer: Self-pay | Admitting: Family

## 2014-09-06 ENCOUNTER — Encounter: Payer: Self-pay | Admitting: Family

## 2014-09-06 VITALS — BP 158/112 | HR 57 | Temp 97.6°F | Resp 18 | Ht 73.0 in | Wt 214.2 lb

## 2014-09-06 DIAGNOSIS — B353 Tinea pedis: Secondary | ICD-10-CM

## 2014-09-06 DIAGNOSIS — K589 Irritable bowel syndrome without diarrhea: Secondary | ICD-10-CM

## 2014-09-06 DIAGNOSIS — I10 Essential (primary) hypertension: Secondary | ICD-10-CM | POA: Insufficient documentation

## 2014-09-06 DIAGNOSIS — R030 Elevated blood-pressure reading, without diagnosis of hypertension: Secondary | ICD-10-CM

## 2014-09-06 MED ORDER — HYDROCHLOROTHIAZIDE 12.5 MG PO CAPS: 12.5000 mg | ORAL_CAPSULE | Freq: Every day | ORAL | Status: DC

## 2014-09-06 MED ORDER — NAFTIFINE HCL 1 % EX CREA: TOPICAL_CREAM | Freq: Every day | CUTANEOUS | Status: DC

## 2014-09-06 MED ORDER — PANCRELIPASE (LIP-PROT-AMYL) 12000-38000 UNITS PO CPEP: 24000.0000 [IU] | ORAL_CAPSULE | Freq: Three times a day (TID) | ORAL | Status: DC

## 2014-09-06 NOTE — Assessment & Plan Note (Signed)
Has had several high readings. Today a retake was 138/90. Start HCTZ when able. Discussed risks to health of high blood pressure. Pt is in agreement with plan. Follow up in 1 month.

## 2014-09-06 NOTE — Progress Notes (Signed)
   Subjective:    Patient ID: Joe Vang, male    DOB: Aug 08, 1996, 18 y.o.   MRN: [id number]  Chief Complaint  Patient presents with  . Follow-up    has not taken any prescribed rx, but says cramps are better and stomach feels better    HPI:  Joe Vang is a 18 y.o. male who presents today for follow-up.   1) IBS - Was previously prescribed bentyl from an ED visit and we previously discussed starting Activia. Has not filled the Bentyl prescription but has been eating the Activia which he states he is currently having less cramping. Continues to have bowel movements 1-2 times per day which vary in consistency depending upon what he has eaten. States he is encouraged but nervous that the symptoms of cramping may return.    2) Blood pressure - Has had several readings of increased blood pressure recently and has some concerns.   BP Readings from Last 3 Encounters:  09/06/14 158/112  08/23/14 188/120  08/17/14 149/99   No Known Allergies  Current Outpatient Prescriptions on File Prior to Visit  Medication Sig Dispense Refill  . ibuprofen (ADVIL,MOTRIN) 200 MG tablet Take 400 mg by mouth every 6 (six) hours as needed for moderate pain.      Marland Kitchen ondansetron (ZOFRAN ODT) 4 MG disintegrating tablet 4mg  ODT q4 hours prn nausea/vomit  4 tablet  0  . dicyclomine (BENTYL) 20 MG tablet Take 1 tablet (20 mg total) by mouth 2 (two) times daily.  20 tablet  0   No current facility-administered medications on file prior to visit.   Review of Systems    See HPI  Objective:    BP 158/112  Pulse 57  Temp(Src) 97.6 F (36.4 C) (Oral)  Resp 18  Ht 6\' 1"  (1.854 m)  Wt 214 lb 3.2 oz (97.16 kg)  BMI 28.27 kg/m2  SpO2 97% Nursing note and vital signs reviewed.  Physical Exam  Constitutional: He is oriented to person, place, and time. He appears well-developed and well-nourished. No distress.  Cardiovascular: Normal rate, regular rhythm, normal heart sounds and intact distal pulses.    Pulmonary/Chest: Effort normal and breath sounds normal.  Neurological: He is alert and oriented to person, place, and time.  Skin: Skin is warm and dry.  Psychiatric: He has a normal mood and affect. His behavior is normal. Judgment and thought content normal.       Assessment & Plan:

## 2014-09-06 NOTE — Progress Notes (Signed)
Pre visit review using our clinic review tool, if applicable. No additional management support is needed unless otherwise documented below in the visit note. 

## 2014-09-06 NOTE — Patient Instructions (Signed)
Thank you for choosing [facility].  Summary/Instructions:   Please pick up your medications when you are able.   Continue to use the Activia   Follow up on your blood pressure in about 1 month.

## 2014-09-06 NOTE — Assessment & Plan Note (Signed)
Currently stable at present. Reports not taking the Bentyl. Has used Activia. Reports decreased cramping and fewer formed bowel movements. Continue Activia daily, and add Creon as trial to assist with diarrhea. Has a scheduled appointment with GI that due to insurance coverage may have to change. Follow up as needed.

## 2014-09-14 ENCOUNTER — Telehealth: Payer: Self-pay | Admitting: Family

## 2014-09-14 NOTE — Telephone Encounter (Signed)
Pt called in said that he went to pick up meds and lipase/protease/amylase (CREON) 12000 UNITS CPEP capsule [[id number]] was $800.00 ins is not coving it.  Needs to know what he can do

## 2014-09-17 NOTE — Telephone Encounter (Signed)
There are no additional coupons or anything to make it cheaper. So therefore continue with the current medications and probiotics and disregard the Creon.  If his symptoms flair, please have him return.

## 2014-10-26 ENCOUNTER — Ambulatory Visit: Payer: Self-pay | Admitting: Internal Medicine

## 2015-02-25 ENCOUNTER — Ambulatory Visit: Payer: Self-pay | Admitting: Internal Medicine

## 2015-02-25 NOTE — Patient Instructions (Signed)
Letter has been mailed to the pt regarding missed appt

## 2015-02-25 NOTE — Progress Notes (Signed)
Patient ID: Joe Vang, male   DOB: 1996-04-16, 19 y.o.   MRN: [id number]    Patient no-showed today's appointment; provider notified for review of record.

## 2015-03-29 ENCOUNTER — Ambulatory Visit: Payer: 59 | Admitting: Family

## 2015-03-29 ENCOUNTER — Telehealth: Payer: Self-pay | Admitting: Family

## 2015-03-29 DIAGNOSIS — Z0289 Encounter for other administrative examinations: Secondary | ICD-10-CM

## 2015-03-29 NOTE — Telephone Encounter (Signed)
Patient no showed for med fu.  Please advise.

## 2015-03-31 NOTE — Telephone Encounter (Signed)
May reschedule if he calls back.

## 2015-04-01 NOTE — Telephone Encounter (Signed)
Noted  

## 2015-04-15 ENCOUNTER — Telehealth: Payer: Self-pay | Admitting: Family

## 2015-04-15 ENCOUNTER — Ambulatory Visit: Payer: 59 | Admitting: Family

## 2015-04-15 DIAGNOSIS — Z0289 Encounter for other administrative examinations: Secondary | ICD-10-CM

## 2015-04-15 NOTE — Telephone Encounter (Signed)
Patient no showed for med fu 6/6.  Please advise.

## 2015-04-15 NOTE — Telephone Encounter (Signed)
May not reschedule with me - establish with another provider.

## 2015-04-17 NOTE — Telephone Encounter (Signed)
Going to send to [place] to see what we need to do.

## 2015-04-24 ENCOUNTER — Ambulatory Visit: Payer: 59 | Admitting: Internal Medicine

## 2015-04-29 NOTE — Telephone Encounter (Signed)
See notes below. Patient called back for a medication refill. Can I schedule patient with Dr. Doug Sou?

## 2015-05-06 ENCOUNTER — Telehealth: Payer: Self-pay | Admitting: Family

## 2015-05-06 NOTE — Telephone Encounter (Signed)
Patient was with Marya Amsler.  Marya Amsler has refused to see patient anymore due to no show's.  Will you be willing to take patient on?

## 2015-05-06 NOTE — Telephone Encounter (Signed)
Unfortunately no, I am seeing many patients who do want to come in. Would recommend he could try to see new provider starting in October.

## 2016-02-10 ENCOUNTER — Emergency Department (HOSPITAL_COMMUNITY)
Admission: EM | Admit: 2016-02-10 | Discharge: 2016-02-10 | Disposition: A | Discharge status: Home / Self Care | Payer: Self-pay | Source: Home / Self Care

## 2016-07-07 ENCOUNTER — Ambulatory Visit: Payer: Self-pay | Admitting: Family

## 2016-07-09 ENCOUNTER — Other Ambulatory Visit: Payer: BLUE CROSS/BLUE SHIELD

## 2016-07-09 ENCOUNTER — Encounter: Payer: Self-pay | Admitting: Family

## 2016-07-09 ENCOUNTER — Ambulatory Visit (INDEPENDENT_AMBULATORY_CARE_PROVIDER_SITE_OTHER): Payer: BLUE CROSS/BLUE SHIELD | Admitting: Family

## 2016-07-09 VITALS — BP 138/104 | HR 58 | Temp 98.0°F | Resp 16 | Ht 73.0 in | Wt 218.0 lb

## 2016-07-09 DIAGNOSIS — I10 Essential (primary) hypertension: Secondary | ICD-10-CM | POA: Diagnosis not present

## 2016-07-09 DIAGNOSIS — D170 Benign lipomatous neoplasm of skin and subcutaneous tissue of head, face and neck: Secondary | ICD-10-CM | POA: Diagnosis not present

## 2016-07-09 DIAGNOSIS — Z711 Person with feared health complaint in whom no diagnosis is made: Secondary | ICD-10-CM

## 2016-07-09 DIAGNOSIS — R21 Rash and other nonspecific skin eruption: Secondary | ICD-10-CM | POA: Insufficient documentation

## 2016-07-09 MED ORDER — LISINOPRIL-HYDROCHLOROTHIAZIDE 20-12.5 MG PO TABS: 1.0000 | ORAL_TABLET | Freq: Every day | ORAL | 1 refills | Status: DC

## 2016-07-09 MED ORDER — TRIAMCINOLONE ACETONIDE 0.1 % EX CREA: 1.0000 "application " | TOPICAL_CREAM | Freq: Two times a day (BID) | CUTANEOUS | 0 refills | Status: DC

## 2016-07-09 NOTE — Assessment & Plan Note (Signed)
Stable area of undetermined cause and cannot rule out insect origin.. Given mildly increased itching, start triamcinolone. If symptoms worsen or do not improve consider referral to dermatology.

## 2016-07-09 NOTE — Assessment & Plan Note (Signed)
Patient is married spouse was positive for HIV with no current history of HIV for himself. Obtain HIV, HSV-1, HSV 2, RPR, and GC/chlamydia probe. Discussed possibility of Truvada in combination with safe sex practices including condom protection. Information on HIV provided to patient. Recommend HIV testing every 6 months and as needed. He will consider Truvada. Additional treatment pending blood work results.

## 2016-07-09 NOTE — Patient Instructions (Signed)
Thank you for choosing [facility].  SUMMARY AND INSTRUCTIONS:  Medication:  Please start taking the lisinopril-hydrochlorothiazide.  Your prescription(s) have been submitted to your pharmacy or been printed and provided for you. Please take as directed and contact our office if you believe you are having problem(s) with the medication(s) or have any questions.  Labs:  Please stop by the lab on the lower level of the building for your blood work. Your results will be released to [facility] (or called to you) after review, usually within 72 hours after test completion. If any changes need to be made, you will be notified at that same time.  1.) The lab is open from 7:30am to 5:30 pm Monday-Friday 2.) No appointment is necessary 3.) Fasting (if needed) is 6-8 hours after food and drink; black coffee and water are okay   Imaging / Radiology:  Please stop by radiology on the basement level of the building for your x-rays. Your results will be released to [place] (or called to you) after review, usually within 72 hours after test completion. If any treatments or changes are necessary, you will be notified at that same time.  Referrals:  Referrals have been made during this visit. You should expect to hear back from our schedulers in about 7-10 days in regards to establishing an appointment with the specialists we discussed.   Follow up:  If your symptoms worsen or fail to improve, please contact our office for further instruction, or in case of emergency go directly to the emergency room at the closest medical facility.    Hypertension Hypertension, commonly called high blood pressure, is when the force of blood pumping through your arteries is too strong. Your arteries are the blood vessels that carry blood from your heart throughout your body. A blood pressure reading consists of a higher number over a lower number, such as 110/72. The higher number (systolic) is the pressure inside  your arteries when your heart pumps. The lower number (diastolic) is the pressure inside your arteries when your heart relaxes. Ideally you want your blood pressure below 120/80. Hypertension forces your heart to work harder to pump blood. Your arteries may become narrow or stiff. Having untreated or uncontrolled hypertension can cause heart attack, stroke, kidney disease, and other problems. RISK FACTORS Some risk factors for high blood pressure are controllable. Others are not.  Risk factors you cannot control include:   Race. You may be at higher risk if you are African American.  Age. Risk increases with age.  Gender. Men are at higher risk than women before age 24 years. After age 64, women are at higher risk than men. Risk factors you can control include:  Not getting enough exercise or physical activity.  Being overweight.  Getting too much fat, sugar, calories, or salt in your diet.  Drinking too much alcohol. SIGNS AND SYMPTOMS Hypertension does not usually cause signs or symptoms. Extremely high blood pressure (hypertensive crisis) may cause headache, anxiety, shortness of breath, and nosebleed. DIAGNOSIS To check if you have hypertension, your health care provider will measure your blood pressure while you are seated, with your arm held at the level of your heart. It should be measured at least twice using the same arm. Certain conditions can cause a difference in blood pressure between your right and left arms. A blood pressure reading that is higher than normal on one occasion does not mean that you need treatment. If it is not clear whether you have high  blood pressure, you may be asked to return on a different day to have your blood pressure checked again. Or, you may be asked to monitor your blood pressure at home for 1 or more weeks. TREATMENT Treating high blood pressure includes making lifestyle changes and possibly taking medicine. Living a healthy lifestyle can help  lower high blood pressure. You may need to change some of your habits. Lifestyle changes may include:  Following the DASH diet. This diet is high in fruits, vegetables, and whole grains. It is low in salt, red meat, and added sugars.  Keep your sodium intake below 2,300 mg per day.  Getting at least 30-45 minutes of aerobic exercise at least 4 times per week.  Losing weight if necessary.  Not smoking.  Limiting alcoholic beverages.  Learning ways to reduce stress. Your health care provider may prescribe medicine if lifestyle changes are not enough to get your blood pressure under control, and if one of the following is true:  You are 43-26 years of age and your systolic blood pressure is above 140.  You are 59 years of age or older, and your systolic blood pressure is above 150.  Your diastolic blood pressure is above 90.  You have diabetes, and your systolic blood pressure is over [id number] or your diastolic blood pressure is over 90.  You have kidney disease and your blood pressure is above 140/90.  You have heart disease and your blood pressure is above 140/90. Your personal target blood pressure may vary depending on your medical conditions, your age, and other factors. HOME CARE INSTRUCTIONS  Have your blood pressure rechecked as directed by your health care provider.   Take medicines only as directed by your health care provider. Follow the directions carefully. Blood pressure medicines must be taken as prescribed. The medicine does not work as well when you skip doses. Skipping doses also puts you at risk for problems.  Do not smoke.   Monitor your blood pressure at home as directed by your health care provider. SEEK MEDICAL CARE IF:   You think you are having a reaction to medicines taken.  You have recurrent headaches or feel dizzy.  You have swelling in your ankles.  You have trouble with your vision. SEEK IMMEDIATE MEDICAL CARE IF:  You develop a severe  headache or confusion.  You have unusual weakness, numbness, or feel faint.  You have severe chest or abdominal pain.  You vomit repeatedly.  You have trouble breathing. MAKE SURE YOU:   Understand these instructions.  Will watch your condition.  Will get help right away if you are not doing well or get worse.   This information is not intended to replace advice given to you by your health care provider. Make sure you discuss any questions you have with your health care provider.   Document Released: 10/26/2005 Document Revised: 03/12/2015 Document Reviewed: 08/18/2013 Elsevier Interactive Patient Education 2016 [place] DASH stands for "Dietary Approaches to Stop Hypertension." The DASH eating plan is a healthy eating plan that has been shown to reduce high blood pressure (hypertension). Additional health benefits may include reducing the risk of type 2 diabetes mellitus, heart disease, and stroke. The DASH eating plan may also help with weight loss. WHAT DO I NEED TO KNOW ABOUT THE DASH EATING PLAN? For the DASH eating plan, you will follow these general guidelines: Choose foods with a percent daily value for sodium of less than 5% (as listed on  the food label). Use salt-free seasonings or herbs instead of table salt or sea salt. Check with your health care provider or pharmacist before using salt substitutes. Eat lower-sodium products, often labeled as "lower sodium" or "no salt added." Eat fresh foods. Eat more vegetables, fruits, and low-fat dairy products. Choose whole grains. Look for the word "whole" as the first word in the ingredient list. Choose fish and skinless chicken or [place] more often than red meat. Limit fish, poultry, and meat to 6 oz (170 g) each day. Limit sweets, desserts, sugars, and sugary drinks. Choose heart-healthy fats. Limit cheese to 1 oz (28 g) per day. Eat more home-cooked food and less restaurant, buffet, and fast  food. Limit fried foods. Cook foods using methods other than frying. Limit canned vegetables. If you do use them, rinse them well to decrease the sodium. When eating at a restaurant, ask that your food be prepared with less salt, or no salt if possible. WHAT FOODS CAN I EAT? Seek help from a dietitian for individual calorie needs. Grains Whole grain or whole wheat bread. Brown rice. Whole grain or whole wheat pasta. Quinoa, bulgur, and whole grain cereals. Low-sodium cereals. Corn or whole wheat flour tortillas. Whole grain cornbread. Whole grain crackers. Low-sodium crackers. Vegetables Fresh or frozen vegetables (raw, steamed, roasted, or grilled). Low-sodium or reduced-sodium tomato and vegetable juices. Low-sodium or reduced-sodium tomato sauce and paste. Low-sodium or reduced-sodium canned vegetables.  Fruits All fresh, canned (in natural juice), or frozen fruits. Meat and Other Protein Products Ground beef (85% or leaner), grass-fed beef, or beef trimmed of fat. Skinless chicken or [place]. Ground chicken or [place]. Pork trimmed of fat. All fish and seafood. Eggs. Dried beans, peas, or lentils. Unsalted nuts and seeds. Unsalted canned beans. Dairy Low-fat dairy products, such as skim or 1% milk, 2% or reduced-fat cheeses, low-fat ricotta or cottage cheese, or plain low-fat yogurt. Low-sodium or reduced-sodium cheeses. Fats and Oils Tub margarines without trans fats. Light or reduced-fat mayonnaise and salad dressings (reduced sodium). Avocado. Safflower, olive, or canola oils. Natural peanut or almond butter. Other Unsalted popcorn and pretzels. The items listed above may not be a complete list of recommended foods or beverages. Contact your dietitian for more options. WHAT FOODS ARE NOT RECOMMENDED? Grains White bread. White pasta. White rice. Refined cornbread. Bagels and croissants. Crackers that contain trans fat. Vegetables Creamed or fried vegetables. Vegetables in a cheese  sauce. Regular canned vegetables. Regular canned tomato sauce and paste. Regular tomato and vegetable juices. Fruits Dried fruits. Canned fruit in light or heavy syrup. Fruit juice. Meat and Other Protein Products Fatty cuts of meat. Ribs, chicken wings, bacon, sausage, bologna, salami, chitterlings, fatback, hot dogs, bratwurst, and packaged luncheon meats. Salted nuts and seeds. Canned beans with salt. Dairy Whole or 2% milk, cream, half-and-half, and cream cheese. Whole-fat or sweetened yogurt. Full-fat cheeses or blue cheese. Nondairy creamers and whipped toppings. Processed cheese, cheese spreads, or cheese curds. Condiments Onion and garlic salt, seasoned salt, table salt, and sea salt. Canned and packaged gravies. Worcestershire sauce. Tartar sauce. Barbecue sauce. Teriyaki sauce. Soy sauce, including reduced sodium. Steak sauce. Fish sauce. Oyster sauce. Cocktail sauce. Horseradish. Ketchup and mustard. Meat flavorings and tenderizers. Bouillon cubes. Hot sauce. Tabasco sauce. Marinades. Taco seasonings. Relishes. Fats and Oils Butter, stick margarine, lard, shortening, ghee, and bacon fat. Coconut, palm kernel, or palm oils. Regular salad dressings. Other Pickles and olives. Salted popcorn and pretzels. The items listed above may not be a complete list  of foods and beverages to avoid. Contact your dietitian for more information. WHERE CAN I FIND MORE INFORMATION? National Heart, Lung, and Blood Institute: travelstabloid.com   This information is not intended to replace advice given to you by your health care provider. Make sure you discuss any questions you have with your health care provider.   Document Released: 10/15/2011 Document Revised: 11/16/2014 Document Reviewed: 08/30/2013 Elsevier Interactive Patient Education 2016 Elsevier Inc.   HIV Infection and AIDS HIV (human immunodeficiency virus) infection is a permanent (chronic) viral infection.  HIV kills white blood cells that are called CD4 cells. These cells help to control your body's defense system (immune system) and fight infection. If you do not have enough CD4 cells, you can develop infections, cancers, and other health problems. If it is not treated, HIV infection advances through three phases:  Asymptomatic phase.  Early symptomatic phase.  Symptomatic phase (also known as AIDS, or acquired immunodeficiency syndrome). CAUSES HIV infection is caused by the human immunodeficiency virus. This virus is passed from one person to another person through sex, through contact with infected blood, or during childbirth or breastfeeding. RISK FACTORS  Having unprotected sex.  Sharing needles or other drug equipment. SYMPTOMS Asymptomatic Phase You may not feel sick, or you may only feel sick some of the time. Many people do not know that they have HIV in this phase. Symptoms may include:  Low-grade fever.  Rash.  Fatigue.  Sore throat.  Headaches.  Nausea, vomiting, or diarrhea.  Night sweats. Early Symptomatic Phase You may notice:  Your early symptoms getting worse or happening more often.  Oral, vaginal, or rectal sores that are caused by infections.  Problems that are related to inflammation, such as joint pain. Symptomatic Phase (AIDS) Your immune system no longer protects you from infections and other health problems. You may get opportunistic diseases, which are infections or conditions that you would not normally get if your immune system was healthy and working properly. Problems that are caused by opportunistic diseases include:  Coughing.  Trouble breathing.  Diarrhea.  Skin sores.  Trouble swallowing.  High fevers.  Blurred vision.  Stiff neck.  Mental confusion. You may also begin to notice:  Weight loss.  Tingling or pain in your hands and feet.  Mouth sores or tooth pain.  Severe fatigue. DIAGNOSIS Your health care provider  will do a screening test that looks for a chemical in your body that is produced only when it is trying to fight HIV. HIV is confirmed with another blood test. TREATMENT There is no cure for HIV infection, but there are treatments that can keep HIV from getting worse. You will be given medicines called antiretroviral therapy (ART) based on your lab tests, your medical history, past treatments for HIV, and your other health problems. ART will:  Keep your immune system as healthy as possible and help it work better.  Decrease the amount of HIV in your body.  Reduce the risk of problems caused by HIV.  Prolong your life.  Improve the quality of your life.  Help prevent passing HIV to someone else. You will need to take ART for the rest of your life. You will need to have routine lab tests performed to monitor your treatment and immune system. HOME CARE INSTRUCTIONS  See your health care provider and have your blood tested every 3-6 months to monitor your health and to make sure your treatment is working.  Take your medicines every day as directed by  your health care provider.  Stop or decrease your use of alcohol, tobacco, and recreational drugs, which can cause further damage to your immune system. They can also cause problems with your liver, lungs, and heart.  Protect yourself from other sexual infections by using condoms when you have sex.  Protect yourself from other blood infections by using your own equipment if you inject, smoke, or snort drugs. Do not share equipment.  Tell your sexual partner(s) that you have HIV. Encourage them to get tested.  Keep your vaccinations up to date. Make sure that you get all recommended vaccines, including vaccines for hepatitis A, hepatitis B, measles, and influenza.  Eat in a healthy way, exercise, and get enough sleep.  See your dentist regularly. Brush and floss you teeth every day.  See a counselor or a Education officer, museum to help you solve  problems and find any services that you need.  Get support from your family and friends. PREVENTION To prevent the spread of HIV:  Talk with your health care provider about protecting your sexual partner(s) from HIV. Your health care provider may encourage your partner(s) to take HIV medicines to decrease the risk of getting HIV. These medicines are called pre-exposure prophylaxis (PrEP).  Use a condom every time you have sexual intercourse. This includes vaginal, oral, and anal sexual activity.  The condom should be in place from the beginning to the end of sexual activity.  Use only latex or polyurethane condoms and water-based lubricants.  Wearing a condom reduces, but does not completely eliminate, your risk of spreading HIV.  Condoms also protect you from other STDs (sexually transmitted diseases).  Avoid alcohol and recreational drugs that affect your judgment. They may make you forget to use a condom or increase your chances of participating in high-risk sex.  Do not share equipment that you use to take drugs, such as needles, syringes, cookers, tourniquets, pipes, or straws. If you share equipment, clean it before and after you use it. SEEK MEDICAL CARE IF:  You lose a lot of weight.  You have extreme fatigue.  You have trouble swallowing.  You have vomiting or diarrhea that does not get better.  You have muscle pain or joint pain.  You have any problems that are related to your medicines. SEEK IMMEDIATE MEDICAL CARE IF:  You have a rash that causes your skin to peel.  You develop blisters inside your mouth.  You have pain in your abdomen.  You have swelling around your eyes or you have eye redness.  You have a high fever and chills.  You have shortness of breath or a cough that is dry (nonproductive) or wet (productive).  You have vision problems, such as blind spots, flashing lights, or decreased or blurred vision.  You have a persistent headache,  confusion, or changes in the way that you think or see things (altered mental status).   This information is not intended to replace advice given to you by your health care provider. Make sure you discuss any questions you have with your health care provider.   Document Released: 08/10/2014 Document Revised: 03/12/2015 Document Reviewed: 08/10/2014 Elsevier Interactive Patient Education 2016 [employer].  Emtricitabine; Tenofovir disoproxil fumarate tablets What is this medicine? EMTRICITABINE; TENOFOVIR DISOPROXIL FUMARATE (em tri SIT uh bean; te NOE fo veer) is two antiretroviral medicines in one tablet. It is used with other medicines to treat HIV and with safe sex practices to prevent HIV in high risk persons. This medicine is not  a cure for HIV. This medicine may be used for other purposes; ask your health care provider or pharmacist if you have questions. What should I tell my health care provider before I take this medicine? They need to know if you have any of these conditions: -bone problems -kidney disease -liver disease -an unusual or allergic reaction to emtricitabine, tenofovir, other medicines, foods, dyes, or preservatives -pregnant or trying to get pregnant -breast-feeding How should I use this medicine? Take this medicine by mouth with a glass of water. Swallow tablets whole; do not cut, crush or chew. Follow the directions on the prescription label. You can take it with or without food. If it upsets your stomach, take it with food. Take your medicine at regular intervals. Do not take your medicine more often than directed. For your anti-HIV therapy to work as well as possible, take each dose exactly as prescribed. Do not skip doses or stop your medicine even if you feel better. Skipping doses may make the HIV virus resistant to this medicine and other medicines. Do not stop taking except on your doctor's advice. Talk to your pediatrician regarding the use of this medicine in  children. While this drug may be prescribed for selected conditions, precautions do apply. Overdosage: If you think you have taken too much of this medicine contact a poison control center or emergency room at once. NOTE: This medicine is only for you. Do not share this medicine with others. What if I miss a dose? If you miss a dose, take it as soon as you can. If it is almost time for your next dose, take only that dose. Do not take double or extra doses. What may interact with this medicine? Do not take this medicine with any of the following medications: -adefovir -any medicine that contains lamivudine -any medicine that contains emtricitabine or tenofovir This medicine may also interact with the following medications: -atazanavir -didanosine, ddI -lopinavir; ritonavir -medicines for viral infections like cidofovir, acyclovir, valacyclovir, ganciclovir, valganciclovir -saquinavir This list may not describe all possible interactions. Give your health care provider a list of all the medicines, herbs, non-prescription drugs, or dietary supplements you use. Also tell them if you smoke, drink alcohol, or use illegal drugs. Some items may interact with your medicine. What should I watch for while using this medicine? Visit your doctor or health care professional for regular check ups. Discuss any new symptoms with your doctor. You will need to have important blood work done while on this medicine. HIV is spread to others through sexual or blood contact. Talk to your doctor about how to stop the spread of HIV. If you have hepatitis B, talk to your doctor if you plan to stop this medicine. The symptoms of hepatitis B may get worse if you stop this medicine. What side effects may I notice from receiving this medicine? Side effects that you should report to your doctor or health care professional as soon as possible: -allergic reactions like skin rash, itching or hives, swelling of the face, lips, or  tongue -breathing difficulties -dark urine -general ill feeling or flu-like symptoms -light-colored stools -loss of appetite -nausea, vomiting, unusual stomach upset or pain -right upper belly pain -trouble passing urine or change in the amount of urine -unusually weak or tired -yellowing of the eyes or skin Side effects that usually do not require medical attention (report to your doctor or health care professional if they continue or are bothersome): -diarrhea -dizziness -headache -skin discoloration on the  hands or feet -weight gain around waist, back, or thinning of face, arms, legs This list may not describe all possible side effects. Call your doctor for medical advice about side effects. You may report side effects to FDA at 1-800-FDA-1088. Where should I keep my medicine? Keep out of the reach of children. Store at room temperature between 15 and 30 degrees C (59 and 86 degrees F). Throw away any unused medicine after the expiration date. NOTE: This sheet is a summary. It may not cover all possible information. If you have questions about this medicine, talk to your doctor, pharmacist, or health care provider.    2016, Elsevier/Gold Standard. (2015-02-14 09:49:34)

## 2016-07-09 NOTE — Progress Notes (Signed)
Subjective:    Patient ID: Joe Vang, male    DOB: November 18, 1995, 20 y.o.   MRN: [id number]  Chief Complaint  Patient presents with  . Follow-up    has bumps on leg, std testing, HIV positive spouse, lump on neck    HPI:  Joe Vang is a 20 y.o. male who  has a past medical history of IBS (irritable bowel syndrome) and Sickle cell trait ([place]). and presents today for a follow up office visit.   1.) Hypertension -  Previously noted to have increased blood pressure and started on hydrochlorothiazide. Has not taken the medication in over 2 years and presents today with a elevated blood pressure. Denies symptoms of end organ damage or worse headache of life. Does not currently monitor blood pressure at home. Not currently following a low-sodium diet.  BP Readings from Last 3 Encounters:  07/09/16 (!) 138/104  09/06/14 (!) 158/112  08/23/14 (!) 188/120    2.) HIV positive spouse - Notes that he has a spouse who is HIV positive and would like to be tested for sexually transmitted disease. Currently in a monogomous relationship with no other risky beviors or potential. Also interested in Truvada.   3.) Lump on neck - This is a new problem. Associated symptom of a lump located on the posterior aspect of his neck has been going on for several years. There is no pain or discomfort. Unsure if it has changed in size.   4.) Bites on legs - This is a new problem. Associated symptom of a lesion located on the lateral aspect of his right ankle has been going on for several weeks and he is unsure if this is related to a bug bite or other source. Denies any modifying factors or attempted treatments that make it better or worse. Has not changed in size.    No Known Allergies   Outpatient Medications Prior to Visit  Medication Sig Dispense Refill  . dicyclomine (BENTYL) 20 MG tablet Take 1 tablet (20 mg total) by mouth 2 (two) times daily. 20 tablet 0  . hydrochlorothiazide (MICROZIDE) 12.5  MG capsule Take 1 capsule (12.5 mg total) by mouth daily. (Patient not taking: Reported on 07/09/2016) 90 capsule 0  . ibuprofen (ADVIL,MOTRIN) 200 MG tablet Take 400 mg by mouth every 6 (six) hours as needed for moderate pain.    Marland Kitchen lipase/protease/amylase (CREON) 12000 UNITS CPEP capsule Take 2 capsules (24,000 Units total) by mouth 3 (three) times daily with meals. 270 capsule 0  . naftifine (NAFTIN) 1 % cream Apply topically daily. For 4 weeks 30 g 0  . ondansetron (ZOFRAN ODT) 4 MG disintegrating tablet 4mg  ODT q4 hours prn nausea/vomit 4 tablet 0   No facility-administered medications prior to visit.     No past surgical history on file.    Past Medical History:  Diagnosis Date  . IBS (irritable bowel syndrome)   . Sickle cell trait ([place])     Review of Systems  Constitutional: Negative for chills, fever and unexpected weight change.  Eyes:       Negative for changes in vision  Respiratory: Negative for cough, chest tightness, shortness of breath and wheezing.   Cardiovascular: Negative for chest pain, palpitations and leg swelling.  Genitourinary: Negative for decreased urine volume, discharge, dysuria, flank pain, frequency and hematuria.  Skin: Positive for rash.  Neurological: Negative for dizziness, weakness and light-headedness.      Objective:    BP (!) 138/104 (BP Location: Left  Arm, Patient Position: Sitting, Cuff Size: Large)   Pulse (!) 58   Temp 98 F (36.7 C) (Oral)   Resp 16   Ht 6\' 1"  (1.854 m)   Wt 218 lb (98.9 kg)   SpO2 97%   BMI 28.76 kg/m  Nursing note and vital signs reviewed.  Physical Exam  Constitutional: He is oriented to person, place, and time. He appears well-developed and well-nourished. No distress.  Neck:  Soft mobile cyst of the posterior right neck without tenderness. Neck ROM is normal with distal pulses and sensations being intact and appropriate.   Cardiovascular: Normal rate, regular rhythm, normal heart sounds and intact distal  pulses.   Pulmonary/Chest: Effort normal and breath sounds normal.  Neurological: He is alert and oriented to person, place, and time.  Skin: Skin is warm and dry.  Approximately nickel sized annular lesion that is darker brown than skin color with well defined borders and no  Increased temperature or discharge. It is not blanchable.   Psychiatric: He has a normal mood and affect. His behavior is normal. Judgment and thought content normal.       Assessment & Plan:   Problem List Items Addressed This Visit      Cardiovascular and Mediastinum   Essential hypertension - Primary    Blood pressure remains elevated above goal 140/90 without medication and lifestyle management at this time. Start losartan-hydrochlorothiazide. Encouraged to monitor blood pressure at home. Information on Dash eating plan provided. Decrease sodium in diet. No symptoms of end organ damage noted upon exam and denies worse headache of life.      Relevant Medications   lisinopril-hydrochlorothiazide (ZESTORETIC) 20-12.5 MG tablet     Musculoskeletal and Integument   Lipoma of neck    Symptoms and exam consistent with lipoma of the neck. Discussed watchful waiting with no need for intervention at this time as there is no pain or neurological symptoms.. Patient wishes to explore options of surgical removal. Referral to general surgery placed.       Relevant Orders   Ambulatory referral to General Surgery   Rash and nonspecific skin eruption    Stable area of undetermined cause and cannot rule out insect origin.. Given mildly increased itching, start triamcinolone. If symptoms worsen or do not improve consider referral to dermatology.        Other   Concern about STD in male without diagnosis    Patient is married spouse was positive for HIV with no current history of HIV for himself. Obtain HIV, HSV-1, HSV 2, RPR, and GC/chlamydia probe. Discussed possibility of Truvada in combination with safe sex practices  including condom protection. Information on HIV provided to patient. Recommend HIV testing every 6 months and as needed. He will consider Truvada. Additional treatment pending blood work results.      Relevant Orders   GC/chlamydia probe amp, urine   HSV 1 antibody, IgG   HSV 2 antibody, IgG   RPR   HIV antibody    Other Visit Diagnoses   None.      I have discontinued Mr. Janice ibuprofen, dicyclomine, ondansetron, lipase/protease/amylase, hydrochlorothiazide, and naftifine. I am also having him maintain his lisinopril-hydrochlorothiazide and triamcinolone cream.   Meds ordered this encounter  Medications  . DISCONTD: lisinopril-hydrochlorothiazide (ZESTORETIC) 20-12.5 MG tablet    Sig: Take 1 tablet by mouth daily.    Dispense:  30 tablet    Refill:  1    Order Specific Question:   Supervising Provider  Answer:   Pricilla Holm A [id number]  . DISCONTD: triamcinolone cream (KENALOG) 0.1 %    Sig: Apply 1 application topically 2 (two) times daily.    Dispense:  30 g    Refill:  0    Order Specific Question:   Supervising Provider    Answer:   Pricilla Holm A [id number]  . lisinopril-hydrochlorothiazide (ZESTORETIC) 20-12.5 MG tablet    Sig: Take 1 tablet by mouth daily.    Dispense:  30 tablet    Refill:  1    Order Specific Question:   Supervising Provider    Answer:   Pricilla Holm A [id number]  . triamcinolone cream (KENALOG) 0.1 %    Sig: Apply 1 application topically 2 (two) times daily.    Dispense:  30 g    Refill:  0    Order Specific Question:   Supervising Provider    Answer:   Pricilla Holm A [id number]     Follow-up: Return in about 1 month (around 08/08/2016), or if symptoms worsen or fail to improve.  Mauricio Po, FNP

## 2016-07-09 NOTE — Assessment & Plan Note (Signed)
Blood pressure remains elevated above goal 140/90 without medication and lifestyle management at this time. Start losartan-hydrochlorothiazide. Encouraged to monitor blood pressure at home. Information on Dash eating plan provided. Decrease sodium in diet. No symptoms of end organ damage noted upon exam and denies worse headache of life.

## 2016-07-09 NOTE — Assessment & Plan Note (Signed)
Symptoms and exam consistent with lipoma of the neck. Discussed watchful waiting with no need for intervention at this time as there is no pain or neurological symptoms.. Patient wishes to explore options of surgical removal. Referral to general surgery placed.

## 2016-07-10 LAB — RPR

## 2016-07-10 LAB — HSV 2 ANTIBODY, IGG: HSV 2 Glycoprotein G Ab, IgG: <0.9 Index (ref <0.90)

## 2016-07-10 LAB — GC/CHLAMYDIA PROBE AMP
CT PROBE, AMP APTIMA: NOT DETECTED
GC PROBE AMP APTIMA: NOT DETECTED

## 2016-07-10 LAB — HSV 1 ANTIBODY, IGG

## 2016-07-10 LAB — HIV ANTIBODY (ROUTINE TESTING W REFLEX): HIV 1&2 Ab, 4th Generation: NONREACTIVE

## 2016-08-19 ENCOUNTER — Telehealth: Payer: Self-pay | Admitting: Emergency Medicine

## 2016-08-19 NOTE — Telephone Encounter (Signed)
[employer] called and needs the diagnosis code changed for the visit 07/09/16. They need them changed to the correct preventive care codes so insurance will pay. Please advise thanks.

## 2016-08-23 ENCOUNTER — Emergency Department (HOSPITAL_COMMUNITY)
Admission: EM | Admit: 2016-08-23 | Discharge: 2016-08-23 | Disposition: A | Discharge status: Home / Self Care | Payer: BLUE CROSS/BLUE SHIELD | Attending: Emergency Medicine | Admitting: Emergency Medicine

## 2016-08-23 ENCOUNTER — Encounter (HOSPITAL_COMMUNITY): Payer: Self-pay | Admitting: Emergency Medicine

## 2016-08-23 DIAGNOSIS — I10 Essential (primary) hypertension: Secondary | ICD-10-CM | POA: Insufficient documentation

## 2016-08-23 DIAGNOSIS — L03116 Cellulitis of left lower limb: Secondary | ICD-10-CM

## 2016-08-23 DIAGNOSIS — M25562 Pain in left knee: Secondary | ICD-10-CM | POA: Diagnosis present

## 2016-08-23 DIAGNOSIS — Z79899 Other long term (current) drug therapy: Secondary | ICD-10-CM | POA: Diagnosis not present

## 2016-08-23 MED ORDER — CEPHALEXIN 500 MG PO CAPS: 500.0000 mg | ORAL_CAPSULE | Freq: Four times a day (QID) | ORAL | 0 refills | Status: DC

## 2016-08-23 NOTE — Discharge Instructions (Signed)
Please use warm compresses, antibiotics as directed, follow up with her primary care for reevaluation, return to the emergency room if he experiences any new or worsening signs or symptoms

## 2016-08-23 NOTE — ED Triage Notes (Signed)
Pt reports insect bite below left knee x3 days ago. Pt reports stinging pain and swelling at the site.

## 2016-08-23 NOTE — ED Provider Notes (Signed)
[facility] DEPT Provider Note   CSN: [id number] Arrival date & time: 08/23/16  2037  By signing my name below, I, [name], attest that this documentation has been prepared under the direction and in the presence of [employer], PA-C. Electronically Signed: [name], Scribe. 08/23/16. 9:28 PM.   History   Chief Complaint Chief Complaint  Patient presents with  . Insect Bite   The history is provided by the patient. No language interpreter was used.    HPI Comments: Joe Vang is a 20 y.o. male who presents to the Emergency Department complaining of a possible insect bite that she noticed 3 days ago. The site is below the left knee and the pain is stinging in quality with swelling. Thinks that a spider may have bitten him. Did not start feeling the pain until today.     Past Medical History:  Diagnosis Date  . IBS (irritable bowel syndrome)   . Sickle cell trait [facility])     Patient Active Problem List   Diagnosis Date Noted  . Concern about STD in male without diagnosis 07/09/2016  . Lipoma of neck 07/09/2016  . Rash and nonspecific skin eruption 07/09/2016  . Essential hypertension 09/06/2014  . Tinea pedis of both feet 08/23/2014  . Irritable bowel syndrome 08/23/2014  . Sickle cell trait ([place])     History reviewed. No pertinent surgical history.     Home Medications    Prior to Admission medications   Medication Sig Start Date End Date Taking? Authorizing Provider  cephALEXin (KEFLEX) 500 MG capsule Take 1 capsule (500 mg total) by mouth 4 (four) times daily. 08/23/16   Okey Regal, PA-C  lisinopril-hydrochlorothiazide (ZESTORETIC) 20-12.5 MG tablet Take 1 tablet by mouth daily. 07/09/16   Golden Circle, FNP  triamcinolone cream (KENALOG) 0.1 % Apply 1 application topically 2 (two) times daily. 07/09/16   Golden Circle, FNP    Family History Family History  Problem Relation Age of Onset  . Hypertension Mother     Social  History Social History  Substance Use Topics  . Smoking status: Never Smoker  . Smokeless tobacco: Never Used  . Alcohol use Yes     Comment: weekends only     Allergies   Review of patient's allergies indicates no known allergies.   Review of Systems Review of Systems  Skin:       Insect bite under left knee with stinging pain and swelling     Physical Exam Updated Vital Signs BP 132/89 (BP Location: Left Arm)   Pulse 77   Temp 97.7 F (36.5 C) (Oral)   Resp 16   Ht 6\' 2"  (1.88 m)   Wt 98.4 kg   SpO2 99%   BMI 27.86 kg/m   Physical Exam  Constitutional: He is oriented to person, place, and time. He appears well-developed and well-nourished.  HENT:  Head: Normocephalic and atraumatic.  Eyes: Conjunctivae are normal. Pupils are equal, round, and reactive to light. Right eye exhibits no discharge. Left eye exhibits no discharge. No scleral icterus.  Neck: Normal range of motion. No JVD present. No tracheal deviation present.  Pulmonary/Chest: Effort normal. No stridor.  Neurological: He is alert and oriented to person, place, and time. Coordination normal.  Skin:  Small whitehead with surrounding induration and approximately 3 cm of mild cellulitis  Psychiatric: He has a normal mood and affect. His behavior is normal. Judgment and thought content normal.  Nursing note and vitals reviewed.  ED Treatments / Results  Labs (all labs ordered are listed, but only abnormal results are displayed) Labs Reviewed - No data to display  EKG  EKG Interpretation None       Radiology No results found.  Procedures Procedures (including critical care time)  Medications Ordered in ED Medications - No data to display   Initial Impression / Assessment and Plan / ED Course  I have reviewed the triage vital signs and the nursing notes.  Pertinent labs & imaging results that were available during my care of the patient were reviewed by me and considered in my medical  decision making (see chart for details).  Clinical Course   Labs:  Imaging:  Consults:   Therapeutics: Keflex.  Discharge Meds:   Assessment/Plan:  Patient's presentation consistent with cellulitis. Bedside ultrasound showed no abscess amenable to drainage.. Advised using hot compresses for 15-20 minutes at a time which will help to resolve the infection. If he has any fever or increasing signs of infection he should return to the ED.  I personally performed the services described in this documentation, which was scribed in my presence. The recorded information has been reviewed and is accurate.   Final Clinical Impressions(s) / ED Diagnoses   Final diagnoses:  Cellulitis of left lower extremity    New Prescriptions New Prescriptions   CEPHALEXIN (KEFLEX) 500 MG CAPSULE    Take 1 capsule (500 mg total) by mouth 4 (four) times daily.     Okey Regal, PA-C 08/23/16 2128    Daleen Bo, MD 08/23/16 2350

## 2016-08-24 NOTE — Telephone Encounter (Signed)
Office visit was reviewed and billed correctly. There was no preventative services exam that date.

## 2016-10-12 ENCOUNTER — Encounter: Payer: Self-pay | Admitting: Family

## 2016-10-12 ENCOUNTER — Ambulatory Visit (INDEPENDENT_AMBULATORY_CARE_PROVIDER_SITE_OTHER): Payer: BLUE CROSS/BLUE SHIELD | Admitting: Family

## 2016-10-12 VITALS — BP 140/80 | HR 68 | Temp 97.8°F | Resp 16 | Ht 74.0 in | Wt 225.0 lb

## 2016-10-12 DIAGNOSIS — R21 Rash and other nonspecific skin eruption: Secondary | ICD-10-CM | POA: Diagnosis not present

## 2016-10-12 DIAGNOSIS — L679 Hair color and hair shaft abnormality, unspecified: Secondary | ICD-10-CM

## 2016-10-12 DIAGNOSIS — I10 Essential (primary) hypertension: Secondary | ICD-10-CM | POA: Diagnosis not present

## 2016-10-12 MED ORDER — DOXYCYCLINE HYCLATE 100 MG PO TABS: 100.0000 mg | ORAL_TABLET | Freq: Two times a day (BID) | ORAL | 0 refills | Status: DC

## 2016-10-12 MED ORDER — LISINOPRIL-HYDROCHLOROTHIAZIDE 20-12.5 MG PO TABS: 1.0000 | ORAL_TABLET | Freq: Every day | ORAL | 1 refills | Status: DC

## 2016-10-12 NOTE — Assessment & Plan Note (Signed)
Hair changes with concern for developing hair loss or possible alopecia. Recommend referral to dermatology for further assessment and treatment. Obtain TSH. Consider biotin supplementation. Continue to monitor.

## 2016-10-12 NOTE — Assessment & Plan Note (Signed)
This is a new problem. Scalp rash with concern for possible cystic lesions. Start doxycycline. Refer to dermatology for further assessment and treatment. Follow-up if symptoms worsen or do not improve.

## 2016-10-12 NOTE — Progress Notes (Signed)
Subjective:    Patient ID: Joe Vang, male    DOB: 10-23-1996, 20 y.o.   MRN: [id number]  Chief Complaint  Patient presents with  . Break out    has a break out that has been going on for 2 years but has gotten worse recently, has had issues with with pus coming out of some places and thinning of his hair    HPI:  Joe Vang is a 20 y.o. male who  has a past medical history of IBS (irritable bowel syndrome) and Sickle cell trait ([place]). and presents today for an acute office visit.  This is a new problem. Associated symptom of a rash located on his head has been going on for about 2 years with worsening over the past several weeks. Describes that his hair is beginning to thin out towards the back. Describes irritation and pus on occasions. Modifying factors include the Sulfur8 shampoo which helps for a couple of days. It has spread since initial onset. Denies fevers.  No Known Allergies    Outpatient Medications Prior to Visit  Medication Sig Dispense Refill  . lisinopril-hydrochlorothiazide (ZESTORETIC) 20-12.5 MG tablet Take 1 tablet by mouth daily. 30 tablet 1  . cephALEXin (KEFLEX) 500 MG capsule Take 1 capsule (500 mg total) by mouth 4 (four) times daily. 40 capsule 0  . triamcinolone cream (KENALOG) 0.1 % Apply 1 application topically 2 (two) times daily. 30 g 0   No facility-administered medications prior to visit.      Review of Systems  Constitutional: Negative for chills and fever.  Skin: Positive for rash.       Positive for changes to hair.       Objective:    BP 140/80 (BP Location: Left Arm, Patient Position: Sitting, Cuff Size: Large)   Pulse 68   Temp 97.8 F (36.6 C) (Oral)   Resp 16   Ht 6\' 2"  (1.88 m)   Wt 225 lb (102.1 kg)   SpO2 98%   BMI 28.89 kg/m  Nursing note and vital signs reviewed.  Physical Exam  Constitutional: He is oriented to person, place, and time. He appears well-developed and well-nourished. No distress.  HENT:    Hair is neatly trimmed and feels firm and brittle. No significant bald spots with small papules/cysts sporadically located around his scalp. No tenderness or discharge.   Cardiovascular: Normal rate, regular rhythm, normal heart sounds and intact distal pulses.   Pulmonary/Chest: Effort normal and breath sounds normal.  Neurological: He is alert and oriented to person, place, and time.  Skin: Skin is warm and dry.  Psychiatric: He has a normal mood and affect. His behavior is normal. Judgment and thought content normal.       Assessment & Plan:   Problem List Items Addressed This Visit      Cardiovascular and Mediastinum   Essential hypertension   Relevant Medications   lisinopril-hydrochlorothiazide (ZESTORETIC) 20-12.5 MG tablet     Musculoskeletal and Integument   Rash and nonspecific skin eruption - Primary    This is a new problem. Scalp rash with concern for possible cystic lesions. Start doxycycline. Refer to dermatology for further assessment and treatment. Follow-up if symptoms worsen or do not improve.      Relevant Medications   doxycycline (VIBRA-TABS) 100 MG tablet     Other   Hair changes    Hair changes with concern for developing hair loss or possible alopecia. Recommend referral to dermatology for further assessment and treatment.  Obtain TSH. Consider biotin supplementation. Continue to monitor.       Relevant Orders   TSH   Ambulatory referral to Dermatology       I have discontinued Mr. Benard triamcinolone cream and cephALEXin. I am also having him start on doxycycline. Additionally, I am having him maintain his lisinopril-hydrochlorothiazide.   Meds ordered this encounter  Medications  . lisinopril-hydrochlorothiazide (ZESTORETIC) 20-12.5 MG tablet    Sig: Take 1 tablet by mouth daily.    Dispense:  30 tablet    Refill:  1  . doxycycline (VIBRA-TABS) 100 MG tablet    Sig: Take 1 tablet (100 mg total) by mouth 2 (two) times daily.    Dispense:   20 tablet    Refill:  0    Order Specific Question:   Supervising Provider    Answer:   Pricilla Holm A [id number]     Follow-up: Return if symptoms worsen or fail to improve.  Mauricio Po, FNP

## 2016-10-12 NOTE — Patient Instructions (Signed)
Thank you for choosing [facility].  SUMMARY AND INSTRUCTIONS:  They will call to schedule your appointment with dermatology.  Medication:  Please start the doxycycline.  Your prescription(s) have been submitted to your pharmacy or been printed and provided for you. Please take as directed and contact our office if you believe you are having problem(s) with the medication(s) or have any questions.  Labs:  Please stop by the lab on the lower level of the building for your blood work. Your results will be released to [place] (or called to you) after review, usually within 72 hours after test completion. If any changes need to be made, you will be notified at that same time.  1.) The lab is open from 7:30am to 5:30 pm Monday-Friday 2.) No appointment is necessary 3.) Fasting (if needed) is 6-8 hours after food and drink; black coffee and water are okay   Follow up:  If your symptoms worsen or fail to improve, please contact our office for further instruction, or in case of emergency go directly to the emergency room at the closest medical facility.

## 2017-07-28 ENCOUNTER — Emergency Department (HOSPITAL_COMMUNITY): Payer: Self-pay

## 2017-07-28 ENCOUNTER — Encounter (HOSPITAL_COMMUNITY): Payer: Self-pay | Admitting: Emergency Medicine

## 2017-07-28 DIAGNOSIS — D573 Sickle-cell trait: Secondary | ICD-10-CM | POA: Insufficient documentation

## 2017-07-28 DIAGNOSIS — R55 Syncope and collapse: Secondary | ICD-10-CM | POA: Insufficient documentation

## 2017-07-28 DIAGNOSIS — I10 Essential (primary) hypertension: Secondary | ICD-10-CM | POA: Insufficient documentation

## 2017-07-28 LAB — CBC
HCT: 52.9 % — ABNORMAL HIGH (ref 39.0–52.0)
Hemoglobin: 18.4 g/dL — ABNORMAL HIGH (ref 13.0–17.0)
MCH: 27.8 pg (ref 26.0–34.0)
MCHC: 34.8 g/dL (ref 30.0–36.0)
MCV: 79.8 fL (ref 78.0–100.0)
PLATELETS: 167 10*3/uL (ref 150–400)
RBC: 6.63 MIL/uL — ABNORMAL HIGH (ref 4.22–5.81)
RDW: 14.1 % (ref 11.5–15.5)
WBC: 4.8 10*3/uL (ref 4.0–10.5)

## 2017-07-28 LAB — BASIC METABOLIC PANEL
Anion gap: 9 (ref 5–15)
BUN: 8 mg/dL (ref 6–20)
CALCIUM: 9.4 mg/dL (ref 8.9–10.3)
CHLORIDE: 103 mmol/L (ref 101–111)
CO2: 26 mmol/L (ref 22–32)
CREATININE: 1.11 mg/dL (ref 0.61–1.24)
GFR calc Af Amer: >60 mL/min (ref >60)
GFR calc non Af Amer: >60 mL/min (ref >60)
Glucose, Bld: 104 mg/dL — ABNORMAL HIGH (ref 65–99)
Potassium: 3.9 mmol/L (ref 3.5–5.1)
SODIUM: 138 mmol/L (ref 135–145)

## 2017-07-28 LAB — URINALYSIS, ROUTINE W REFLEX MICROSCOPIC
Bilirubin Urine: NEGATIVE
GLUCOSE, UA: NEGATIVE mg/dL
HGB URINE DIPSTICK: NEGATIVE
KETONES UR: 20 mg/dL — AB
LEUKOCYTES UA: NEGATIVE
Nitrite: NEGATIVE
PROTEIN: NEGATIVE mg/dL
Specific Gravity, Urine: 1.015 (ref 1.005–1.030)
pH: 6 (ref 5.0–8.0)

## 2017-07-28 LAB — CBG MONITORING, ED: Glucose-Capillary: 106 mg/dL — ABNORMAL HIGH (ref 65–99)

## 2017-07-28 NOTE — ED Triage Notes (Signed)
Dr. Winfred Leeds in triage to see pt, verbal for CT Head

## 2017-07-28 NOTE — ED Provider Notes (Signed)
MSE exam  Patient had gradual in onset frontal headache 6 PM  tonight headache became worse at 7 PM. Symptoms accompanied by tingling in ll of his fingers and nausea. He had 2 near syncope events in the car on the way here. He's not had headaches like this before. On exam he is alert appears mildly anxious. Cranial nerves II through XII grossly intact. Moves all extremity as well. Glasgow Coma Score 15   Orlie Dakin, MD 07/28/17 2147

## 2017-07-28 NOTE — ED Triage Notes (Signed)
Pt states he has had HA since 1700, pt went to [facility] to check his BP b/c hx of HBP and noted to be 190's/140's. Pt states he does not take an BP meds. Pt then had a near syncopal episode after getting in the car. Pt appears dazed in triage but is A/OX4

## 2017-07-29 ENCOUNTER — Emergency Department (HOSPITAL_COMMUNITY)
Admission: EM | Admit: 2017-07-29 | Discharge: 2017-07-29 | Disposition: A | Discharge status: Home / Self Care | Payer: Self-pay | Attending: Emergency Medicine | Admitting: Emergency Medicine

## 2017-07-29 DIAGNOSIS — R519 Headache, unspecified: Secondary | ICD-10-CM

## 2017-07-29 DIAGNOSIS — R55 Syncope and collapse: Secondary | ICD-10-CM

## 2017-07-29 DIAGNOSIS — I10 Essential (primary) hypertension: Secondary | ICD-10-CM

## 2017-07-29 DIAGNOSIS — R51 Headache: Secondary | ICD-10-CM

## 2017-07-29 HISTORY — DX: Essential (primary) hypertension: I10

## 2017-07-29 LAB — I-STAT TROPONIN, ED: TROPONIN I, POC: 0.01 ng/mL (ref 0.00–0.08)

## 2017-07-29 MED ORDER — HYDROCHLOROTHIAZIDE 12.5 MG PO TABS
12.5000 mg | ORAL_TABLET | Freq: Every day | ORAL | 3 refills | Status: AC
Start: 2017-07-29 — End: ?

## 2017-07-29 MED ORDER — LISINOPRIL 20 MG PO TABS
20.0000 mg | ORAL_TABLET | Freq: Once | ORAL | Status: AC
  Administered 2017-07-29: 20 mg via ORAL
  Filled 2017-07-29: qty 1

## 2017-07-29 MED ORDER — LISINOPRIL 20 MG PO TABS: 20.0000 mg | ORAL_TABLET | Freq: Every day | ORAL | 3 refills | Status: AC

## 2017-07-29 MED ORDER — HYDROCHLOROTHIAZIDE 12.5 MG PO CAPS
12.5000 mg | ORAL_CAPSULE | Freq: Once | ORAL | Status: AC
  Administered 2017-07-29: 12.5 mg via ORAL
  Filled 2017-07-29: qty 1

## 2017-07-29 NOTE — ED Provider Notes (Signed)
TIME SEEN: 2:17 AM  CHIEF COMPLAINT: Hypertension, headache, syncope  HPI: Pt is a 21 y.o. with history of hypertension who has been off medications because he cannot afford it who presents to the emergency department with headache. Describes the headache as gradual onset, severe that started at 7 PM. States that he rarely gets headaches that this made him concerned and he went to [facility] to check his blood pressure. His blood pressure [facility] was in the 190/140s. He states that he called his cousin to drive him to the emergency department. He states that he got very anxious and started hyperventilating and had tingling all over his head and in both hands. He states that he passed out briefly. He woke back up and began hyperventilating again and had another syncopal event. No seizure activity. No tongue biting or incontinence. No chest pain but did have some shortness of breath during this episode. No shortness of breath currently. No current numbness, tingling or focal weakness. His headache is now a 3/10 without intervention. Not on antiplatelets or anticoagulants. No head injury. No fever. He reports feeling much better. Blood pressure currently in the 140s/100s.  No history of PE, DVT, recent fractures, surgery, trauma, hospitalization or prolonged travel. No lower extremity swelling or pain. No calf tenderness.  ROS: See HPI Constitutional: no fever  Eyes: no drainage  ENT: no runny nose   Cardiovascular:  no chest pain  Resp:  SOB  GI: no vomiting GU: no dysuria Integumentary: no rash  Allergy: no hives  Musculoskeletal: no leg swelling  Neurological: no slurred speech ROS otherwise negative  PAST MEDICAL HISTORY/PAST SURGICAL HISTORY:  Past Medical History:  Diagnosis Date  . Hypertension   . IBS (irritable bowel syndrome)   . Sickle cell trait (HCC)     MEDICATIONS:  Prior to Admission medications   Medication Sig Start Date End Date Taking? Authorizing Provider  doxycycline  (VIBRA-TABS) 100 MG tablet Take 1 tablet (100 mg total) by mouth 2 (two) times daily. 10/12/16   Golden Circle, FNP  lisinopril-hydrochlorothiazide (ZESTORETIC) 20-12.5 MG tablet Take 1 tablet by mouth daily. 10/12/16   Golden Circle, FNP    ALLERGIES:  No Known Allergies  SOCIAL HISTORY:  Social History  Substance Use Topics  . Smoking status: Never Smoker  . Smokeless tobacco: Never Used  . Alcohol use Yes     Comment: weekends only    FAMILY HISTORY: Family History  Problem Relation Age of Onset  . Hypertension Mother     EXAM: BP (!) 146/107   Pulse (!) 55   Temp 98.7 F (37.1 C) (Oral)   Resp 17   Ht 6\' 2"  (1.88 m)   Wt 97.1 kg (214 lb)   SpO2 99%   BMI 27.48 kg/m  CONSTITUTIONAL: Alert and oriented and responds appropriately to questions. Well-appearing; well-nourished HEAD: Normocephalic, Atraumatic EYES: Conjunctivae clear, pupils appear equal, EOMI ENT: normal nose; moist mucous membranes NECK: Supple, no meningismus, no nuchal rigidity, no LAD  CARD: RRR; S1 and S2 appreciated; no murmurs, no clicks, no rubs, no gallops RESP: Normal chest excursion without splinting or tachypnea; breath sounds clear and equal bilaterally; no wheezes, no rhonchi, no rales, no hypoxia or respiratory distress, speaking full sentences ABD/GI: Normal bowel sounds; non-distended; soft, non-tender, no rebound, no guarding, no peritoneal signs, no hepatosplenomegaly BACK:  The back appears normal and is non-tender to palpation, there is no CVA tenderness EXT: Normal ROM in all joints; non-tender to palpation; no edema;  normal capillary refill; no cyanosis, no calf tenderness or swelling    SKIN: Normal color for age and race; warm; no rash NEURO: Moves all extremities equally; Strength 5/5 in all four extremities.  Normal sensation diffusely.  CN 2-12 grossly intact.  No dysmetria to finger to nose testing bilaterally.  Normal speech.  Normal gait. PSYCH: The patient's mood and  manner are appropriate. Grooming and personal hygiene are appropriate.  MEDICAL DECISION MAKING: Patient here with gradual onset headache. Symptoms started at 7 PM. Was noted to be very hypertensive when he checked his blood pressure at [facility]. Had 2 syncopal events but the sound likely vasovagal in nature, related to hyperventilation and he agrees. Headache is now 3/10 without intervention and his blood pressure has come down. CT of his head obtained in triage, 4 hours after onset of symptoms shows no acute abnormality including no bleed. Labs are unremarkable other than mild hemoconcentration. Normal creatinine. Urine shows no proteinuria or hematuria. Patient agrees to EKG and troponin but declines chest x-ray. I have asked him if he wants anything for his mild headache but he declines. I do not feel that this is a subarachnoid hemorrhage. I do not feel he needs a lumbar puncture. He is neurologically intact this time. I suspect that his syncopal events were vasovagal in nature. He has no risk factors for PE. Doubt dissection.  ED PROGRESS: Patient's troponin is negative. EKG shows T-wave changes consistent with hypertension. Have discussed this with patient and have recommended that he restart his blood pressure medication. He was previously on a combination pill which is likely why he could not afford it. Will give him lisinopril and hydrochlorothiazide as separate prescriptions which should be on the [facility] $4-$10 plan which he states he should be able to afford. I have given him outpatient follow-up as well. Recommend alternating Tylenol and Motrin for headache. Discussed at length return precautions. Patient comfortable with this plan.   At this time, I do not feel there is any life-threatening condition present. I have reviewed and discussed all results (EKG, imaging, lab, urine as appropriate) and exam findings with patient/family. I have reviewed nursing notes and appropriate previous records.  I  feel the patient is safe to be discharged home without further emergent workup and can continue workup as an outpatient as needed. Discussed usual and customary return precautions. Patient/family verbalize understanding and are comfortable with this plan.  Outpatient follow-up has been provided if needed. All questions have been answered.    EKG Interpretation  Date/Time:  Wednesday July 28 2017 21:33:58 EDT Ventricular Rate:  64 PR Interval:  162 QRS Duration: 100 QT Interval:  386 QTC Calculation: 398 R Axis:   77 Text Interpretation:  Normal sinus rhythm with sinus arrhythmia ST & T wave abnormality, consider inferior ischemia Abnormal ECG No old tracing to compare Confirmed by Ward, Cyril Mourning [phone]) on 07/29/2017 2:16:35 AM         Ward, Delice Bison, DO 07/29/17 6301

## 2017-07-29 NOTE — Discharge Instructions (Signed)
You may alternate Tylenol 1000 mg every 6 hours as needed for pain and Ibuprofen 800 mg every 8 hours as needed for pain.  Please take Ibuprofen with food. ° ° ° °To find a primary care or specialty doctor please call [phone] or [phone] to access "[facility] Find a Doctor Service." ° °You may also go on the [facility] website at [email or web address] ° °There are also multiple [facility], [facility], [facility] and [facility] practices throughout the [place] that are frequently accepting new patients. You may find a clinic that is close to your home and contact them. ° ° and Well[facility] -  °[address] °[place] [address] °[phone] ° ° °[facility] -  °[address] °[place] [address] °[phone] ° ° °[facility] - °[address]  °[place] [address] °[phone] ° ° °

## 2021-05-17 ENCOUNTER — Encounter (HOSPITAL_COMMUNITY): Payer: Self-pay

## 2021-05-17 ENCOUNTER — Emergency Department (HOSPITAL_COMMUNITY): Payer: Self-pay

## 2021-05-17 ENCOUNTER — Emergency Department (HOSPITAL_COMMUNITY)
Admission: EM | Admit: 2021-05-17 | Discharge: 2021-05-18 | Disposition: A | Discharge status: Home / Self Care | Payer: Self-pay | Attending: Emergency Medicine | Admitting: Emergency Medicine

## 2021-05-17 ENCOUNTER — Other Ambulatory Visit: Payer: Self-pay

## 2021-05-17 DIAGNOSIS — Z79899 Other long term (current) drug therapy: Secondary | ICD-10-CM | POA: Insufficient documentation

## 2021-05-17 DIAGNOSIS — R072 Precordial pain: Secondary | ICD-10-CM | POA: Insufficient documentation

## 2021-05-17 DIAGNOSIS — I10 Essential (primary) hypertension: Secondary | ICD-10-CM | POA: Insufficient documentation

## 2021-05-17 DIAGNOSIS — R030 Elevated blood-pressure reading, without diagnosis of hypertension: Secondary | ICD-10-CM

## 2021-05-17 LAB — CBC
HCT: 58.5 % — ABNORMAL HIGH (ref 39.0–52.0)
Hemoglobin: 19.4 g/dL — ABNORMAL HIGH (ref 13.0–17.0)
MCH: 27.1 pg (ref 26.0–34.0)
MCHC: 33.2 g/dL (ref 30.0–36.0)
MCV: 81.8 fL (ref 80.0–100.0)
Platelets: 197 10*3/uL (ref 150–400)
RBC: 7.15 MIL/uL — ABNORMAL HIGH (ref 4.22–5.81)
RDW: 17.1 % — ABNORMAL HIGH (ref 11.5–15.5)
WBC: 5.8 10*3/uL (ref 4.0–10.5)
nRBC: 0 % (ref 0.0–0.2)

## 2021-05-17 LAB — BASIC METABOLIC PANEL
Anion gap: 9 (ref 5–15)
BUN: 11 mg/dL (ref 6–20)
CO2: 22 mmol/L (ref 22–32)
Calcium: 8.7 mg/dL — ABNORMAL LOW (ref 8.9–10.3)
Chloride: 105 mmol/L (ref 98–111)
Creatinine, Ser: 0.82 mg/dL (ref 0.61–1.24)
GFR, Estimated: >60 mL/min (ref >60)
Glucose, Bld: 99 mg/dL (ref 70–99)
Potassium: 4.5 mmol/L (ref 3.5–5.1)
Sodium: 136 mmol/L (ref 135–145)

## 2021-05-17 LAB — TROPONIN I (HIGH SENSITIVITY): Troponin I (High Sensitivity): 5 ng/L (ref <18)

## 2021-05-17 NOTE — ED Triage Notes (Signed)
Pt reports chest pain that radiates to bilateral shoulders and elevated BP since 7/4. Pt reports being off of his BP medication for about a month and a half.

## 2021-05-18 ENCOUNTER — Encounter (HOSPITAL_COMMUNITY): Payer: Self-pay | Admitting: Emergency Medicine

## 2021-05-18 NOTE — ED Provider Notes (Signed)
[facility] DEPT Provider Note   CSN: [id number] Arrival date & time: 05/17/21  2121     History Chief Complaint  Patient presents with   Chest Pain   Hypertension    Joe Vang is a 25 y.o. male.  The history is provided by the patient.  Chest Pain Pain location:  Substernal area Pain quality: dull   Pain radiates to:  R shoulder and L shoulder Pain severity:  Moderate Onset quality:  Gradual Duration:  5 days Timing:  Constant Progression:  Unchanged Chronicity:  New Context: at rest   Relieved by:  Nothing Worsened by:  Nothing Ineffective treatments:  None tried Associated symptoms: no abdominal pain, no AICD problem, no altered mental status, no anorexia, no anxiety, no back pain, no claudication, no cough, no diaphoresis, no dizziness, no dysphagia, no fatigue, no fever, no headache, no heartburn, no lower extremity edema, no nausea, no near-syncope, no palpitations, no shortness of breath, no syncope, no vomiting and no weakness   Risk factors: male sex   Risk factors: no aortic disease   Hypertension This is a chronic problem. The current episode started more than 1 week ago. The problem occurs constantly. The problem has been gradually improving. Associated symptoms include chest pain. Pertinent negatives include no abdominal pain, no headaches and no shortness of breath. Nothing aggravates the symptoms. Nothing relieves the symptoms. He has tried nothing for the symptoms. The treatment provided no relief.      Past Medical History:  Diagnosis Date   Hypertension    IBS (irritable bowel syndrome)    Sickle cell trait [facility])     Patient Active Problem List   Diagnosis Date Noted   Hair changes 10/12/2016   Concern about STD in male without diagnosis 07/09/2016   Lipoma of neck 07/09/2016   Rash and nonspecific skin eruption 07/09/2016   Essential hypertension 09/06/2014   Tinea pedis of both feet 08/23/2014   Irritable  bowel syndrome 08/23/2014   Sickle cell trait ([facility])     History reviewed. No pertinent surgical history.     Family History  Problem Relation Age of Onset   Hypertension Mother     Social History   Tobacco Use   Smoking status: Never   Smokeless tobacco: Never  Substance Use Topics   Alcohol use: Yes    Comment: weekends only   Drug use: Yes    Types: Marijuana    Comment: weekends only    Home Medications Prior to Admission medications   Medication Sig Start Date End Date Taking? Authorizing Provider  hydrochlorothiazide (HYDRODIURIL) 12.5 MG tablet Take 1 tablet (12.5 mg total) by mouth daily. 07/29/17   Ward, Delice Bison, DO  lisinopril (PRINIVIL,ZESTRIL) 20 MG tablet Take 1 tablet (20 mg total) by mouth daily. 07/29/17   Ward, Delice Bison, DO    Allergies    Patient has no known allergies.  Review of Systems   Review of Systems  Constitutional:  Negative for diaphoresis, fatigue and fever.  HENT:  Negative for drooling and trouble swallowing.   Eyes:  Negative for redness.  Respiratory:  Negative for cough and shortness of breath.   Cardiovascular:  Positive for chest pain. Negative for palpitations, claudication, syncope and near-syncope.  Gastrointestinal:  Negative for abdominal pain, anorexia, heartburn, nausea and vomiting.  Genitourinary:  Negative for difficulty urinating.  Musculoskeletal:  Negative for back pain.  Skin:  Negative for rash.  Neurological:  Negative for dizziness, weakness and headaches.  Psychiatric/Behavioral:  Negative for agitation.   All other systems reviewed and are negative.  Physical Exam Updated Vital Signs BP (!) 138/114   Pulse 71   Temp 98.2 F (36.8 C) (Oral)   Resp 17   SpO2 99%   Physical Exam Vitals and nursing note reviewed.  Constitutional:      Appearance: Normal appearance. He is not ill-appearing.  HENT:     Head: Normocephalic and atraumatic.     Nose: Nose normal.  Eyes:     Conjunctiva/sclera:  Conjunctivae normal.     Pupils: Pupils are equal, round, and reactive to light.  Cardiovascular:     Rate and Rhythm: Normal rate and regular rhythm.     Pulses: Normal pulses.     Heart sounds: Normal heart sounds.  Pulmonary:     Effort: Pulmonary effort is normal.     Breath sounds: Normal breath sounds. No wheezing.  Abdominal:     General: Abdomen is flat.     Palpations: Abdomen is soft.     Tenderness: There is no abdominal tenderness. There is no guarding.  Musculoskeletal:        General: Normal range of motion.     Cervical back: Neck supple.     Right lower leg: No edema.     Left lower leg: No edema.  Skin:    General: Skin is warm and dry.     Capillary Refill: Capillary refill takes less than 2 seconds.  Neurological:     General: No focal deficit present.     Mental Status: He is alert and oriented to person, place, and time.     Deep Tendon Reflexes: Reflexes normal.  Psychiatric:     Comments: anxious    ED Results / Procedures / Treatments   Labs (all labs ordered are listed, but only abnormal results are displayed) Results for orders placed or performed during the hospital encounter of 78/93/81  Basic metabolic panel  Result Value Ref Range   Sodium 136 135 - 145 mmol/L   Potassium 4.5 3.5 - 5.1 mmol/L   Chloride 105 98 - 111 mmol/L   CO2 22 22 - 32 mmol/L   Glucose, Bld 99 70 - 99 mg/dL   BUN 11 6 - 20 mg/dL   Creatinine, Ser 0.82 0.61 - 1.24 mg/dL   Calcium 8.7 (L) 8.9 - 10.3 mg/dL   GFR, Estimated >60 >60 mL/min   Anion gap 9 5 - 15  CBC  Result Value Ref Range   WBC 5.8 4.0 - 10.5 K/uL   RBC 7.15 (H) 4.22 - 5.81 MIL/uL   Hemoglobin 19.4 (H) 13.0 - 17.0 g/dL   HCT 58.5 (H) 39.0 - 52.0 %   MCV 81.8 80.0 - 100.0 fL   MCH 27.1 26.0 - 34.0 pg   MCHC 33.2 30.0 - 36.0 g/dL   RDW 17.1 (H) 11.5 - 15.5 %   Platelets 197 150 - 400 K/uL   nRBC 0.0 0.0 - 0.2 %  Troponin I (High Sensitivity)  Result Value Ref Range   Troponin I (High Sensitivity) 5  <18 ng/L   DG Chest 2 View  Result Date: 05/17/2021 CLINICAL DATA:  Chest pain for several days, initial encounter EXAM: CHEST - 2 VIEW COMPARISON:  None. FINDINGS: The heart size and mediastinal contours are within normal limits. Both lungs are clear. The visualized skeletal structures are unremarkable. IMPRESSION: No active cardiopulmonary disease. Electronically Signed   By: Linus Mako.D.  On: 05/17/2021 22:21     EKG EKG Interpretation  Date/Time:  Saturday May 17 2021 21:55:59 EDT Ventricular Rate:  64 PR Interval:  166 QRS Duration: 92 QT Interval:  390 QTC Calculation: 402 R Axis:   94 Text Interpretation: Normal sinus rhythm Rightward axis Confirmed by Randal Buba, Lenwood Balsam ([id number]) on 05/17/2021 11:35:25 PM  Radiology DG Chest 2 View  Result Date: 05/17/2021 CLINICAL DATA:  Chest pain for several days, initial encounter EXAM: CHEST - 2 VIEW COMPARISON:  None. FINDINGS: The heart size and mediastinal contours are within normal limits. Both lungs are clear. The visualized skeletal structures are unremarkable. IMPRESSION: No active cardiopulmonary disease. Electronically Signed   By: Inez Catalina M.D.   On: 05/17/2021 22:21    Procedures Procedures   Medications Ordered in ED Medications - No data to display  ED Course  I have reviewed the triage vital signs and the nursing notes.  Pertinent labs & imaging results that were available during my care of the patient were reviewed by me and considered in my medical decision making (see chart for details).  BP normalized.  Given ongoing chest pain > 8 hours continuously one negative EKG and troponin is sufficient to exclude ACS.  HEART score is 1 low risk for MACE.  PERC Negative, Wells 0, highly doubt PE in this low risk patient.  I suspect the Patient is using an inaccurate BP cuff at home and this has caused anxiety.  I have advised not checking BP on an inaccurate cuff and following up with his doctor.  Strict return precautions  given.    Joe Vang was evaluated in Emergency Department on 05/18/2021 for the symptoms described in the history of present illness. He was evaluated in the context of the global COVID-19 pandemic, which necessitated consideration that the patient might be at risk for infection with the SARS-CoV-2 virus that causes COVID-19. Institutional protocols and algorithms that pertain to the evaluation of patients at risk for COVID-19 are in a state of rapid change based on information released by regulatory bodies including the CDC and federal and state organizations. These policies and algorithms were followed during the patient's care in the ED.    Final Clinical Impression(s) / ED Diagnoses Final diagnoses:  None   Return for intractable cough, coughing up blood, fevers > 100.4 unrelieved by medication, shortness of breath, intractable vomiting, chest pain, shortness of breath, weakness, numbness, changes in speech, facial asymmetry, abdominal pain, passing out, Inability to tolerate liquids or food, cough, altered mental status or any concerns. No signs of systemic illness or infection. The patient is nontoxic-appearing on exam and vital signs are within normal limits. I have reviewed the triage vital signs and the nursing notes. Pertinent labs & imaging results that were available during my care of the patient were reviewed by me and considered in my medical decision making (see chart for details). After history, exam, and medical workup I feel the patient has been appropriately medically screened and is safe for discharge home. Pertinent diagnoses were discussed with the patient. Patient was given return precautions.  Rx / DC Orders ED Discharge Orders     None        Evolett Somarriba, MD 05/18/21 9030

## 2022-03-08 ENCOUNTER — Other Ambulatory Visit: Payer: Self-pay

## 2022-03-08 ENCOUNTER — Encounter (HOSPITAL_COMMUNITY): Payer: Self-pay | Admitting: Emergency Medicine

## 2022-03-08 ENCOUNTER — Emergency Department (HOSPITAL_COMMUNITY)
Admission: EM | Admit: 2022-03-08 | Discharge: 2022-03-09 | Disposition: A | Discharge status: Home / Self Care | Payer: Self-pay | Attending: Emergency Medicine | Admitting: Emergency Medicine

## 2022-03-08 DIAGNOSIS — Z79899 Other long term (current) drug therapy: Secondary | ICD-10-CM | POA: Diagnosis not present

## 2022-03-08 DIAGNOSIS — R1032 Left lower quadrant pain: Secondary | ICD-10-CM | POA: Diagnosis not present

## 2022-03-08 DIAGNOSIS — I10 Essential (primary) hypertension: Secondary | ICD-10-CM | POA: Insufficient documentation

## 2022-03-08 DIAGNOSIS — Y92410 Unspecified street and highway as the place of occurrence of the external cause: Secondary | ICD-10-CM | POA: Insufficient documentation

## 2022-03-08 DIAGNOSIS — R1030 Lower abdominal pain, unspecified: Secondary | ICD-10-CM | POA: Diagnosis present

## 2022-03-08 MED ORDER — ONDANSETRON HCL 4 MG/2ML IJ SOLN
4.0000 mg | Freq: Once | INTRAMUSCULAR | Status: AC
  Administered 2022-03-09: 4 mg via INTRAVENOUS
  Filled 2022-03-08: qty 2

## 2022-03-08 MED ORDER — HYDROMORPHONE HCL 1 MG/ML IJ SOLN
0.5000 mg | Freq: Once | INTRAMUSCULAR | Status: AC
  Administered 2022-03-09: 0.5 mg via INTRAVENOUS
  Filled 2022-03-08: qty 1
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
## 2022-03-09 ENCOUNTER — Emergency Department (HOSPITAL_COMMUNITY): Payer: Self-pay

## 2022-03-09 LAB — I-STAT CHEM 8, ED
BUN: 13 mg/dL (ref 6–20)
Calcium, Ion: 1.18 mmol/L (ref 1.15–1.40)
Chloride: 101 mmol/L (ref 98–111)
Creatinine, Ser: 0.8 mg/dL (ref 0.61–1.24)
Glucose, Bld: 95 mg/dL (ref 70–99)
HCT: 57 % — ABNORMAL HIGH (ref 39.0–52.0)
Hemoglobin: 19.4 g/dL — ABNORMAL HIGH (ref 13.0–17.0)
Potassium: 3.6 mmol/L (ref 3.5–5.1)
Sodium: 139 mmol/L (ref 135–145)
TCO2: 27 mmol/L (ref 22–32)

## 2022-03-09 LAB — COMPREHENSIVE METABOLIC PANEL
ALT: 32 U/L (ref 0–44)
AST: 25 U/L (ref 15–41)
Albumin: 4.7 g/dL (ref 3.5–5.0)
Alkaline Phosphatase: 59 U/L (ref 38–126)
Anion gap: 7 (ref 5–15)
BUN: 14 mg/dL (ref 6–20)
CO2: 26 mmol/L (ref 22–32)
Calcium: 9.4 mg/dL (ref 8.9–10.3)
Chloride: 104 mmol/L (ref 98–111)
Creatinine, Ser: 0.6 mg/dL — ABNORMAL LOW (ref 0.61–1.24)
GFR, Estimated: >60 mL/min (ref >60)
Glucose, Bld: 99 mg/dL (ref 70–99)
Potassium: 3.8 mmol/L (ref 3.5–5.1)
Sodium: 137 mmol/L (ref 135–145)
Total Bilirubin: 0.7 mg/dL (ref 0.3–1.2)
Total Protein: 7.9 g/dL (ref 6.5–8.1)

## 2022-03-09 LAB — CBC WITH DIFFERENTIAL/PLATELET
Abs Immature Granulocytes: 0.02 10*3/uL (ref 0.00–0.07)
Basophils Absolute: 0 10*3/uL (ref 0.0–0.1)
Basophils Relative: 1 %
Eosinophils Absolute: 0.1 10*3/uL (ref 0.0–0.5)
Eosinophils Relative: 1 %
HCT: 56.7 % — ABNORMAL HIGH (ref 39.0–52.0)
Hemoglobin: 19.3 g/dL — ABNORMAL HIGH (ref 13.0–17.0)
Immature Granulocytes: 0 %
Lymphocytes Relative: 35 %
Lymphs Abs: 1.8 10*3/uL (ref 0.7–4.0)
MCH: 27.1 pg (ref 26.0–34.0)
MCHC: 34 g/dL (ref 30.0–36.0)
MCV: 79.7 fL — ABNORMAL LOW (ref 80.0–100.0)
Monocytes Absolute: 0.6 10*3/uL (ref 0.1–1.0)
Monocytes Relative: 11 %
Neutro Abs: 2.6 10*3/uL (ref 1.7–7.7)
Neutrophils Relative %: 52 %
Platelets: 201 10*3/uL (ref 150–400)
RBC: 7.11 MIL/uL — ABNORMAL HIGH (ref 4.22–5.81)
RDW: 16.4 % — ABNORMAL HIGH (ref 11.5–15.5)
WBC: 5.1 10*3/uL (ref 4.0–10.5)
nRBC: 0 % (ref 0.0–0.2)

## 2022-03-09 MED ORDER — IOHEXOL 300 MG/ML  SOLN
100.0000 mL | Freq: Once | INTRAMUSCULAR | Status: AC | PRN
  Administered 2022-03-09: 100 mL via INTRAVENOUS

## 2022-03-09 NOTE — Discharge Instructions (Signed)
Exam and imaging were all reassuring use over-the-counter pain medication as needed for pain management. ? ?Follow-up with PCP for further evaluation. ? ?Come back to the emergency department if you develop chest pain, shortness of breath, severe abdominal pain, uncontrolled nausea, vomiting, diarrhea. ? ?
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
